# Patient Record
Sex: Male | Born: 1955 | Race: White | Hispanic: No | Marital: Married | Smoking: Never smoker
Health system: Southern US, Community
[De-identification: ages and names within clinical notes are randomized; demographics above are authoritative.]

## PROBLEM LIST (undated history)

## (undated) DIAGNOSIS — Z8739 Personal history of other diseases of the musculoskeletal system and connective tissue: Secondary | ICD-10-CM

## (undated) DIAGNOSIS — I10 Essential (primary) hypertension: Secondary | ICD-10-CM

## (undated) DIAGNOSIS — E039 Hypothyroidism, unspecified: Secondary | ICD-10-CM

## (undated) DIAGNOSIS — F32A Depression, unspecified: Secondary | ICD-10-CM

## (undated) DIAGNOSIS — F329 Major depressive disorder, single episode, unspecified: Secondary | ICD-10-CM

## (undated) DIAGNOSIS — E119 Type 2 diabetes mellitus without complications: Secondary | ICD-10-CM

## (undated) DIAGNOSIS — C801 Malignant (primary) neoplasm, unspecified: Secondary | ICD-10-CM

## (undated) DIAGNOSIS — R590 Localized enlarged lymph nodes: Secondary | ICD-10-CM

## (undated) DIAGNOSIS — G43909 Migraine, unspecified, not intractable, without status migrainosus: Secondary | ICD-10-CM

## (undated) HISTORY — PX: TONSILLECTOMY: SHX5217

## (undated) HISTORY — DX: Depression, unspecified: F32.A

## (undated) HISTORY — DX: Malignant (primary) neoplasm, unspecified: C80.1

## (undated) HISTORY — DX: Personal history of other diseases of the musculoskeletal system and connective tissue: Z87.39

## (undated) HISTORY — DX: Migraine, unspecified, not intractable, without status migrainosus: G43.909

## (undated) HISTORY — DX: Type 2 diabetes mellitus without complications: E11.9

## (undated) HISTORY — PX: LITHOTRIPSY: SUR834

## (undated) HISTORY — PX: TOOTH EXTRACTION: SUR596

## (undated) HISTORY — DX: Major depressive disorder, single episode, unspecified: F32.9

---

## 1998-09-14 ENCOUNTER — Encounter: Payer: Self-pay | Admitting: Urology

## 1998-09-14 ENCOUNTER — Ambulatory Visit (HOSPITAL_COMMUNITY): Admission: RE | Admit: 1998-09-14 | Discharge: 1998-09-14 | Payer: Self-pay | Admitting: Urology

## 1999-03-26 ENCOUNTER — Encounter: Admission: RE | Admit: 1999-03-26 | Discharge: 1999-03-26 | Payer: Self-pay | Admitting: Orthopedic Surgery

## 1999-06-03 ENCOUNTER — Ambulatory Visit (HOSPITAL_BASED_OUTPATIENT_CLINIC_OR_DEPARTMENT_OTHER): Admission: RE | Admit: 1999-06-03 | Discharge: 1999-06-03 | Payer: Self-pay | Admitting: Orthopedic Surgery

## 1999-08-28 ENCOUNTER — Encounter: Payer: Self-pay | Admitting: Family Medicine

## 1999-08-28 ENCOUNTER — Encounter: Admission: RE | Admit: 1999-08-28 | Discharge: 1999-08-28 | Payer: Self-pay | Admitting: Family Medicine

## 2002-12-06 ENCOUNTER — Encounter: Admission: RE | Admit: 2002-12-06 | Discharge: 2002-12-06 | Payer: Self-pay | Admitting: Family Medicine

## 2002-12-06 ENCOUNTER — Encounter: Payer: Self-pay | Admitting: Family Medicine

## 2003-01-17 ENCOUNTER — Ambulatory Visit (HOSPITAL_COMMUNITY): Admission: RE | Admit: 2003-01-17 | Discharge: 2003-01-17 | Payer: Self-pay | Admitting: Family Medicine

## 2003-01-17 ENCOUNTER — Encounter: Payer: Self-pay | Admitting: Family Medicine

## 2009-05-28 ENCOUNTER — Encounter: Admission: RE | Admit: 2009-05-28 | Discharge: 2009-06-14 | Payer: Self-pay | Admitting: Orthopedic Surgery

## 2009-06-18 ENCOUNTER — Encounter: Admission: RE | Admit: 2009-06-18 | Discharge: 2009-07-31 | Payer: Self-pay | Admitting: Orthopedic Surgery

## 2009-07-09 ENCOUNTER — Encounter: Admission: RE | Admit: 2009-07-09 | Discharge: 2009-07-09 | Payer: Self-pay | Admitting: Family Medicine

## 2010-01-08 ENCOUNTER — Encounter: Admission: RE | Admit: 2010-01-08 | Discharge: 2010-01-08 | Payer: Self-pay | Admitting: Family Medicine

## 2010-04-24 ENCOUNTER — Encounter
Admission: RE | Admit: 2010-04-24 | Discharge: 2010-06-11 | Payer: Self-pay | Source: Home / Self Care | Attending: Neurosurgery | Admitting: Neurosurgery

## 2010-06-20 ENCOUNTER — Encounter
Admission: RE | Admit: 2010-06-20 | Discharge: 2010-07-15 | Payer: Self-pay | Source: Home / Self Care | Attending: Neurosurgery | Admitting: Neurosurgery

## 2010-06-24 ENCOUNTER — Encounter: Admit: 2010-06-24 | Payer: Self-pay | Admitting: Neurosurgery

## 2015-02-01 ENCOUNTER — Other Ambulatory Visit: Payer: Self-pay | Admitting: Family Medicine

## 2015-02-01 DIAGNOSIS — R229 Localized swelling, mass and lump, unspecified: Principal | ICD-10-CM

## 2015-02-01 DIAGNOSIS — IMO0002 Reserved for concepts with insufficient information to code with codable children: Secondary | ICD-10-CM

## 2015-02-05 ENCOUNTER — Ambulatory Visit
Admission: RE | Admit: 2015-02-05 | Discharge: 2015-02-05 | Disposition: A | Discharge status: Home / Self Care | Payer: Commercial Indemnity | Source: Ambulatory Visit | Attending: Family Medicine | Admitting: Family Medicine

## 2015-02-05 DIAGNOSIS — IMO0002 Reserved for concepts with insufficient information to code with codable children: Secondary | ICD-10-CM

## 2015-02-05 DIAGNOSIS — R229 Localized swelling, mass and lump, unspecified: Principal | ICD-10-CM

## 2015-05-16 ENCOUNTER — Other Ambulatory Visit: Payer: Self-pay | Admitting: Otolaryngology

## 2015-05-16 DIAGNOSIS — R590 Localized enlarged lymph nodes: Secondary | ICD-10-CM

## 2015-07-12 ENCOUNTER — Ambulatory Visit
Admission: RE | Admit: 2015-07-12 | Discharge: 2015-07-12 | Disposition: A | Discharge status: Home / Self Care | Payer: 59 | Source: Ambulatory Visit | Attending: Otolaryngology | Admitting: Otolaryngology

## 2015-07-12 DIAGNOSIS — R590 Localized enlarged lymph nodes: Secondary | ICD-10-CM

## 2015-07-12 MED ORDER — IOPAMIDOL (ISOVUE-300) INJECTION 61%
75.0000 mL | Freq: Once | INTRAVENOUS | Status: AC | PRN
  Administered 2015-07-12: 75 mL via INTRAVENOUS

## 2015-07-23 ENCOUNTER — Other Ambulatory Visit (HOSPITAL_COMMUNITY)
Admission: RE | Admit: 2015-07-23 | Discharge: 2015-07-23 | Disposition: A | Discharge status: Home / Self Care | Payer: 59 | Source: Ambulatory Visit | Attending: Otolaryngology | Admitting: Otolaryngology

## 2015-07-23 ENCOUNTER — Other Ambulatory Visit: Payer: Self-pay | Admitting: Otolaryngology

## 2015-07-23 DIAGNOSIS — R590 Localized enlarged lymph nodes: Secondary | ICD-10-CM | POA: Diagnosis present

## 2015-07-31 ENCOUNTER — Other Ambulatory Visit (HOSPITAL_COMMUNITY): Payer: Self-pay | Admitting: Otolaryngology

## 2015-07-31 DIAGNOSIS — C770 Secondary and unspecified malignant neoplasm of lymph nodes of head, face and neck: Secondary | ICD-10-CM

## 2015-08-03 ENCOUNTER — Encounter (HOSPITAL_COMMUNITY): Payer: 59

## 2015-08-09 ENCOUNTER — Ambulatory Visit (HOSPITAL_COMMUNITY)
Admission: RE | Admit: 2015-08-09 | Discharge: 2015-08-09 | Disposition: A | Discharge status: Home / Self Care | Payer: 59 | Source: Ambulatory Visit | Attending: Otolaryngology | Admitting: Otolaryngology

## 2015-08-09 DIAGNOSIS — C801 Malignant (primary) neoplasm, unspecified: Secondary | ICD-10-CM | POA: Diagnosis not present

## 2015-08-09 DIAGNOSIS — N200 Calculus of kidney: Secondary | ICD-10-CM | POA: Insufficient documentation

## 2015-08-09 DIAGNOSIS — J359 Chronic disease of tonsils and adenoids, unspecified: Secondary | ICD-10-CM | POA: Insufficient documentation

## 2015-08-09 DIAGNOSIS — C770 Secondary and unspecified malignant neoplasm of lymph nodes of head, face and neck: Secondary | ICD-10-CM

## 2015-08-09 DIAGNOSIS — K5730 Diverticulosis of large intestine without perforation or abscess without bleeding: Secondary | ICD-10-CM | POA: Diagnosis not present

## 2015-08-09 LAB — GLUCOSE, CAPILLARY: Glucose-Capillary: 165 mg/dL — ABNORMAL HIGH (ref 65–99)

## 2015-08-09 MED ORDER — FLUDEOXYGLUCOSE F - 18 (FDG) INJECTION
10.1500 | Freq: Once | INTRAVENOUS | Status: AC | PRN
  Administered 2015-08-09: 10.15 via INTRAVENOUS

## 2015-08-13 ENCOUNTER — Telehealth: Payer: Self-pay | Admitting: *Deleted

## 2015-08-13 ENCOUNTER — Telehealth: Payer: Self-pay | Admitting: Hematology and Oncology

## 2015-08-13 NOTE — Telephone Encounter (Signed)
  Oncology Nurse Navigator Documentation  Navigator Location: [facility] (08/13/15 1403) Navigator Encounter Type: Introductory phone call (08/13/15 1403)   Abnormal Finding Date: 07/12/15 (08/13/15 1403) Confirmed Diagnosis Date: 07/23/15 (08/13/15 1403)         Barriers/Navigation Needs: Coordination of Care (08/13/15 1403)   Interventions: Coordination of Care (08/13/15 1403)       Placed introductory call to new referral patient.  Introduced myself as the oncology nurse navigator that works with Drs. Isidore Moos and Alvy Bimler to whom he has been referred by Dr. Constance Holster.  He confirmed understanding of referral and appt date/times of 8:30 NE, 9:00 Dr. Isidore Moos and 10:30 Dr. Alvy Bimler.  He indicated his wife will be joining him.  I briefly explained my role as a navigator, indicated that I would be joining them during these appts.  I confirmed understanding of the [facility] location, explained arrival and RadOnc registration process for appt. I explained the purpose of a dental evaluation prior to starting RT, indicated he wd be contacted by Dr. Ritta Slot office to arrange an appt within a day or so of his appt with Dr. Isidore Moos.   I provided my contact information, encouraged him to call with questions/concerns before Friday's appts.   He verbalized understanding of information provided, expressed appreciation for my call.  Gayleen Orem, RN, BSN, [facility] [phone]                     Time Spent with Patient: 15 (08/13/15 1403)

## 2015-08-13 NOTE — Telephone Encounter (Signed)
S/w patient and gave np appt for 03/03 @ 10:30 w/Dr. Alvy Bimler.

## 2015-08-14 NOTE — Progress Notes (Signed)
Head and Neck Cancer Location of Tumor / Histology: PET impression states: IMPRESSION: 1. 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 Recommend correlation with direct visualization. 2. Hypermetabolic ipsilateral nodal metastases in the right level 2 neck.   Patient presented with symptoms of: He noticed a lump in his neck on the Right side this past March. He reported to Dr. Constance Holster that it had not changed or caused any pain or discomfort.   Biopsies of revealed:  Fine Needle Aspriation performed 07/23/15 07/23/15 Diagnosis LYMPH NODE, FINE NEEDLE ASPIRATION, RIGHT LEVEL 3, (SPECIMEN 1 OF 1 COLLECTED 07/23/2015) MALIGNANT CELLS CONSISTENT WITH SQUAMOUS CELL CARCINOMA.  Nutrition Status Yes No Comments  Weight changes? [x]  []  He reports some weight loss, but it was an intention diet change. He reports maybe 10-12 lbs.   Swallowing concerns? []  [x]    PEG? []  [x]     Referrals Yes No Comments  Social Work? []  [x]    Dentistry? []  [x]  They plan to call and schedule one next week.   Swallowing therapy? []  [x]    Nutrition? []  [x]    Med/Onc? []  [x]  Appointment scheduled for 10:30 with Dr. Alvy Bimler today (08/17/15)   Safety Issues Yes No Comments  Prior radiation? []  [x]    Pacemaker/ICD? []  [x]    Possible current pregnancy? []  [x]    Is the patient on methotrexate? []  [x]     Tobacco/Marijuana/Snuff/ETOH use:  Never smoker   Past/Anticipated interventions by otolaryngology, if any: Dr. Constance Holster saw him on 07/23/15 and performed a fine needle aspiration.  Past/Anticipated interventions by medical oncology, if any: He has an appointment with Dr. Alvy Bimler today (3/3?17) at 10:30     Current Complaints / other details:   PET scan 08/09/15 IMPRESSION: 1. Focal asymmetric hypermetabolism in the anterior right tonsillar pillar/right palatine tonsil, likely to represent a primary head  & neck squamous cell carcinoma. Recommend correlation with direct visualization. 2. Hypermetabolic ipsilateral nodal metastases in the right level 2 neck. 3. No hypermetabolic distant metastatic disease. 4. Nonobstructing tiny left renal stones. 5. Marked sigmoid diverticulosis.  BP 135/68 mmHg  Pulse 81  Temp(Src) 98.8 F (37.1 C)  Ht 5' 9.5" (1.765 m)  Wt 208 lb 12.8 oz (94.711 kg)  BMI 30.40 kg/m2  SpO2 98%   Wt Readings from Last 3 Encounters:  08/17/15 208 lb 12.8 oz (94.711 kg)

## 2015-08-15 ENCOUNTER — Encounter: Payer: Self-pay | Admitting: Hematology and Oncology

## 2015-08-15 DIAGNOSIS — C099 Malignant neoplasm of tonsil, unspecified: Secondary | ICD-10-CM | POA: Insufficient documentation

## 2015-08-16 ENCOUNTER — Encounter: Payer: Self-pay | Admitting: Radiation Oncology

## 2015-08-16 ENCOUNTER — Telehealth (HOSPITAL_COMMUNITY): Payer: Self-pay

## 2015-08-16 NOTE — Telephone Encounter (Signed)
08/16/15         Patient called to cancelled Dental Consult appt. w/Dr. Enrique Sack on 08/20/15 @ [id number] due to a conflict w/another appt.   Patient will call back to reschedule at a later date.   LRI

## 2015-08-16 NOTE — Telephone Encounter (Signed)
08/16/15            Called and left mgs. for patient to contact Dental Medicine to schedule a Dental Consult w/Dr. Enrique Sack.   LRI

## 2015-08-17 ENCOUNTER — Encounter: Payer: Self-pay | Admitting: Hematology and Oncology

## 2015-08-17 ENCOUNTER — Ambulatory Visit
Admission: RE | Admit: 2015-08-17 | Discharge: 2015-08-17 | Disposition: A | Discharge status: Home / Self Care | Payer: 59 | Source: Ambulatory Visit | Attending: Radiation Oncology | Admitting: Radiation Oncology

## 2015-08-17 ENCOUNTER — Encounter: Payer: Self-pay | Admitting: *Deleted

## 2015-08-17 ENCOUNTER — Encounter: Payer: Self-pay | Admitting: Radiation Oncology

## 2015-08-17 ENCOUNTER — Ambulatory Visit (HOSPITAL_BASED_OUTPATIENT_CLINIC_OR_DEPARTMENT_OTHER): Payer: 59 | Admitting: Hematology and Oncology

## 2015-08-17 ENCOUNTER — Telehealth: Payer: Self-pay | Admitting: *Deleted

## 2015-08-17 VITALS — BP 135/68 | HR 81 | Temp 98.8°F | Ht 69.5 in | Wt 208.8 lb

## 2015-08-17 VITALS — BP 141/93 | HR 88 | Temp 98.1°F | Resp 18 | Wt 208.2 lb

## 2015-08-17 DIAGNOSIS — Z8051 Family history of malignant neoplasm of kidney: Secondary | ICD-10-CM

## 2015-08-17 DIAGNOSIS — H9191 Unspecified hearing loss, right ear: Secondary | ICD-10-CM | POA: Diagnosis not present

## 2015-08-17 DIAGNOSIS — R5382 Chronic fatigue, unspecified: Secondary | ICD-10-CM | POA: Diagnosis not present

## 2015-08-17 DIAGNOSIS — Z808 Family history of malignant neoplasm of other organs or systems: Secondary | ICD-10-CM

## 2015-08-17 DIAGNOSIS — C099 Malignant neoplasm of tonsil, unspecified: Secondary | ICD-10-CM | POA: Diagnosis not present

## 2015-08-17 DIAGNOSIS — G894 Chronic pain syndrome: Secondary | ICD-10-CM

## 2015-08-17 DIAGNOSIS — H9193 Unspecified hearing loss, bilateral: Secondary | ICD-10-CM

## 2015-08-17 DIAGNOSIS — C091 Malignant neoplasm of tonsillar pillar (anterior) (posterior): Secondary | ICD-10-CM

## 2015-08-17 HISTORY — DX: Hypothyroidism, unspecified: E03.9

## 2015-08-17 HISTORY — DX: Localized enlarged lymph nodes: R59.0

## 2015-08-17 HISTORY — DX: Essential (primary) hypertension: I10

## 2015-08-17 NOTE — Progress Notes (Signed)
Box CONSULT NOTE  No care team member to display  CHIEF COMPLAINTS/PURPOSE OF CONSULTATION:  Right tonsil cancer, HPV unavailable  HISTORY OF PRESENTING ILLNESS:  Dwayne Williams 60 y.o. male is here because of newly diagnosed recent diagnosis of right tonsil cancer According to the patient, the first initial presentation was due to right neck swelling that has been present for over a year. The patient has baseline hearing deficit I reviewed his records extensively and collaborated the history with the patient, family members and review of records. Summary of oncologic history as follows:   Tonsil cancer ([place])   07/12/2015 Imaging CT neck showed prominent tonsil and bilateral LN involvement in the neck   07/23/2015 Procedure He had FNA of right level 3 LN in the neck   07/23/2015 Pathology Results [id number] is positive for invasive squamous cell carcinoma   08/09/2015 Imaging PET CT showed abnormal metabolism over the right tonsil region and bilateral LN involvement    he denies difficulties with chewing food, swallowing difficulties, painful swallowing, changes in the quality of his voice or abnormal weight loss. He is contemplating second opinion for surgical option at [facility] in the near future He denies new lymphadenopathy elsewhere He has profound chronic fatigue and chronic back pain. He is not working  MEDICAL HISTORY:  Past Medical History  Diagnosis Date  . Hypothyroidism   . Hypertension   . Cervical lymphadenopathy   . Diabetes mellitus without complication ([place])   . Cancer ([place])     TOnsil ca  . Depression     SURGICAL HISTORY: Past Surgical History  Procedure Laterality Date  . Tooth extraction    . Tonsillectomy    . Lithotripsy    . Chronic back pain      SOCIAL HISTORY: Social History   Social History  . Marital Status: Married    Spouse Name: N/A  . Number of Children: N/A  . Years of Education: N/A   Occupational History   . Not on file.   Social History Main Topics  . Smoking status: Never Smoker   . Smokeless tobacco: Never Used  . Alcohol Use: No  . Drug Use: No  . 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 Not on file   Social History Narrative    FAMILY HISTORY: Family History  Problem Relation Age of Onset  . Cancer Father     kidney ca  . Cancer Brother     throat ca  . Cancer Maternal Uncle     melanoma  . Cancer Paternal Uncle     brain ca    ALLERGIES:  is allergic to latex and penicillins.  MEDICATIONS:  Current Outpatient Prescriptions  Medication Sig Dispense Refill  . AMLODIPINE BESYLATE PO Take 10 mg by mouth daily.    . finasteride (PROPECIA) 1 MG tablet     . levothyroxine (SYNTHROID, LEVOTHROID) 25 MCG tablet Take 25 mcg by mouth daily before breakfast.    . losartan (COZAAR) 100 MG tablet Take 100 mg by mouth daily.    . metFORMIN (GLUCOPHAGE) 500 MG tablet Take by mouth 2 (two) times daily with a meal.    . oxyCODONE-acetaminophen (PERCOCET) 10-325 MG tablet Take 1 tablet by mouth every 4 (four) hours as needed for pain.    . rizatriptan (MAXALT-MLT) 10 MG disintegrating tablet Take 10 mg by mouth as needed for  migraine. May repeat in 2 hours if needed    . venlafaxine XR (EFFEXOR-XR) 150 MG 24 hr capsule Take 150 mg by mouth daily with breakfast.     No current facility-administered medications for this visit.    REVIEW OF SYSTEMS:   Constitutional: Denies fevers, chills or abnormal night sweats Eyes: Denies blurriness of vision, double vision or watery eyes Ears, nose, mouth, throat, and face: Denies mucositis or sore throat Respiratory: Denies cough, dyspnea or wheezes Cardiovascular: Denies palpitation, chest discomfort or lower extremity swelling Gastrointestinal:  Denies nausea, heartburn or change in bowel habits Skin: Denies abnormal skin rashes Neurological:Denies numbness,  tingling or new weaknesses Behavioral/Psych: Mood is stable, no new changes  All other systems were reviewed with the patient and are negative.  PHYSICAL EXAMINATION: ECOG PERFORMANCE STATUS: 1 - Symptomatic but completely ambulatory  Filed Vitals:   08/17/15 1028  BP: 141/93  Pulse: 88  Temp: 98.1 F (36.7 C)  Resp: 18   Filed Weights   08/17/15 1028  Weight: 208 lb 3.2 oz (94.439 kg)    GENERAL:alert, no distress and comfortable. He is obese SKIN: skin color, texture, turgor are normal, no rashes or significant lesions EYES: normal, conjunctiva are pink and non-injected, sclera clear OROPHARYNX:no exudate, no erythema and lips, buccal mucosa, and tongue normal  NECK: supple, thyroid normal size, non-tender, without nodularity LYMPH:she has palpable lymphadenopathy on the right side the neck S: clear to auscultation and percussion with normal breathing effort HEART: regular rate & rhythm and no murmurs and no lower extremity edema ABDOMEN:abdomen soft, non-tender and normal bowel sounds Musculoskeletal:no cyanosis of digits and no clubbing  PSYCH: alert & oriented x 3 with fluent speech NEURO: no focal motor/sensory deficits  LABORATORY DATA:  I have reviewed the data as listed RADIOGRAPHIC STUDIES: I have personally reviewed the radiological images as listed and agreed with the findings in the report. Nm Pet Image Initial (pi) Skull Base To Thigh  08/09/2015  CLINICAL DATA:  Initial treatment strategy for squamous cell carcinoma detected within right level 3 lymph node on 07/23/2015 ultrasound-guided FNA. EXAM: NUCLEAR MEDICINE PET SKULL BASE TO THIGH TECHNIQUE: 10.2 mCi F-18 FDG was injected intravenously. Full-ring PET imaging was performed from the skull base to thigh after the radiotracer. CT data was obtained and used for attenuation correction and anatomic localization. FASTING BLOOD GLUCOSE:  Value: 165 mg/dl COMPARISON:  07/12/2015 head CT. 09/17/2010 unenhanced CT  abdomen/pelvis. FINDINGS: NECK There is asymmetry focal intense FDG uptake with max SUV 10.5 in the anterior right tonsillar pillar/right palatine tonsil, which is likely to represent the primary mucosal malignancy. There is a hypermetabolic 1.6 cm high right level 2 lymph node posterior to the right mandibular angle (series 4/ image 27) with max SUV 4.9. There is a hypermetabolic 2.0 cm low right level 2 lymph node (series 4/ image 32) with max SUV 5.4. There is a mildly hypermetabolic 0.8 cm posterior right level 2 lymph node just medial to the posterior margin of the right sternocleidomastoid muscle (series 4/image 30) with max SUV 3.0. There are no additional hypermetabolic or enlarged cervical lymph nodes. CHEST No hypermetabolic axillary, mediastinal or hilar nodes. No acute consolidative airspace disease or significant pulmonary nodules. ABDOMEN/PELVIS No abnormal hypermetabolic activity within the liver, pancreas, adrenal glands, or spleen. No hypermetabolic lymph nodes in the abdomen or pelvis. Simple 1.2 cm renal cyst in the posterior interpolar right kidney. Nonobstructing 2 mm stone in the interpolar left kidney. Nonobstructing 1 mm stone in the  upper left kidney. No hydronephrosis. Marked sigmoid diverticulosis. SKELETON No focal hypermetabolic activity to suggest skeletal metastasis. Subcutaneous FDG uptake in the left antecubital fossa is injection site related. IMPRESSION: 1. 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 Recommend correlation with direct visualization. 2. Hypermetabolic ipsilateral nodal metastases in the right level 2 neck. 3. No hypermetabolic distant metastatic disease. 4. Nonobstructing tiny left renal stones. 5. Marked sigmoid diverticulosis. Electronically Signed   By: Ilona Sorrel M.D.   On: 08/09/2015 09:35    ASSESSMENT:  Newly diagnosed squamous cell  carcinoma of the Head & Neck, HPV N/A  PLAN:  Hearing loss of both ears For this reason, I will not prescribe cisplatin. I recommend baseline hearing test before we proceed with treatment  Chronic pain disorder The patient take chronic narcotic prescription for chronic back pain. During treatment, I will take over pain management but after his treatment is completed, he will resume chronic pain management through his primary care doctor.   Tonsil cancer [facility]) We discussed multidisciplinary approach to management of locally advanced tonsil cancer. 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 Given his baseline hearing loss, I will recommend concurrent treatment with cetuximab instead of cisplatin. He would need placement of feeding tube and port prior to the start of treatment as well as attendance to chemotherapy teaching class, referral to nutritionist, speech and language therapist, physical therapist and dentist evaluation. The patient desire second opinion at [facility] and he will be referred there prior to initiation of treatment. I will wait to hear back from him before proceeding with plan as outlined above    All questions were answered. The patient knows to call the clinic with any problems, questions or concerns. I spent 40 minutes counseling the patient face to face. The total time spent in the appointment was 60 minutes and more than 50% was on counseling.     [place], [place], MD 08/17/2015 1:50 PM

## 2015-08-17 NOTE — Progress Notes (Signed)
Radiation Oncology         [phone] ________________________________  Initial outpatient Consultation  Name: Dwayne Williams MRN: [id number]  Date: 08/17/2015  DOB: Jan 28, 1956  CC:No primary care provider on file.  Izora Gala, MD   REFERRING PHYSICIAN: Izora Gala, MD  DIAGNOSIS:    ICD-9-CM ICD-10-CM   1. Tonsil cancer ([place]) 146.0 C09.9 Ambulatory referral to Social Work  2. 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 male who presented with a lump in the right neck since 08-2014. Subsequently, the patient saw Dr. Constance Holster who biopsied a neck node (FNA) on 07-23-15. Biopsy of the node revealed: malignant cell consistent with Squamous cell carcinoma. p16 couldn't be tested on scant tissue.  Pertinent imaging thus far includes CT of Neck with contrast performed on 07-13-15 revealing right neck adenopathy. PET on 08-09-15 revealed asymmetric hypermetabolism in the anterior right tonsillar pillar/ palatine tonsil as right neck nodal metastatic. No distant metastases.   On today's visit, the patient states he is chronically fatigued and is concerned that radiation treatment will exacerbate his thyroid issues. We discussed this in detail. States both his tonsils were taken out as a child.   Swallowing issues, if any: None  Weight Changes: He reports an intentional diet with weight loss of ~ 10-12 lbs  Pain status: None reported   Other symptoms: Patient states he is chronically fatigued. The patient is currently on thyroid medication.   Tobacco history, if any: He denies any history of smoking   ETOH abuse, if any: No significant alcohol history  Prior cancers, if any: No  PREVIOUS RADIATION THERAPY: No  PAST MEDICAL HISTORY:  has a past medical history of Hypothyroidism; Hypertension; and Cervical lymphadenopathy.    PAST SURGICAL  HISTORY: Past Surgical History  Procedure Laterality Date  . Tooth extraction    . Tonsillectomy    . Lithotripsy      FAMILY HISTORY: Brother had head and neck cancer.  SOCIAL HISTORY:  reports that he has never smoked. He does not have any smokeless tobacco history on file.  ALLERGIES: Latex and Penicillins  MEDICATIONS:  Current Outpatient Prescriptions  Medication Sig Dispense Refill  . AMLODIPINE BESYLATE PO Take 10 mg by mouth daily.    Marland Kitchen levothyroxine (SYNTHROID, LEVOTHROID) 25 MCG tablet Take 25 mcg by mouth daily before breakfast.    . losartan (COZAAR) 100 MG tablet Take 100 mg by mouth daily.    . metFORMIN (GLUCOPHAGE) 500 MG tablet Take by mouth 2 (two) times daily with a meal.    . oxyCODONE-acetaminophen (PERCOCET) 10-325 MG tablet Take 1 tablet by mouth every 4 (four) hours as needed for pain.    . rizatriptan (MAXALT-MLT) 10 MG disintegrating tablet Take 10 mg by mouth as needed for migraine. May repeat in 2 hours if needed    . venlafaxine XR (EFFEXOR-XR) 150 MG 24 hr capsule Take 150 mg by mouth daily with breakfast.    . finasteride (PROPECIA) 1 MG tablet     . oxyCODONE-acetaminophen (PERCOCET/ROXICET) 5-325 MG tablet      No current facility-administered medications for this encounter.   REVIEW OF SYSTEMS:  Notable for that above.    PHYSICAL EXAM:  height is 5' 9.5" (1.765 m) and weight is 208 lb 12.8 oz (94.711 kg). His temperature is 98.8 F (37.1 C). His blood pressure  is 135/68 and his pulse is 81. His oxygen saturation is 98%.   General: Alert and oriented, in no acute distress HEENT: Head is normocephalic. Extraocular movements are intact. Oropharynx is clear, no tumor or thrush seen. Neck: Neck is notable for a right level 2 mass approximately 2.5 cm in greatest dimension. I cannot appreciate any other cervical or supraclavicular masses.  Heart: Regular in rate and rhythm with no murmurs, rubs, or gallops. Chest: Clear to auscultation bilaterally,  with no rhonchi, wheezes, or rales. Abdomen: Soft, nontender, nondistended, with no rigidity or guarding. Extremities: No cyanosis or edema. Lymphatics: see Neck Exam Skin: No concerning lesions. Musculoskeletal: symmetric strength and muscle tone throughout. Neurologic: Cranial nerves II through XII are grossly intact. No obvious focalities. Speech is fluent. Coordination is intact. Strength is symmetric in his extremities.  Psychiatric: Judgment and insight are intact. Affect is appropriate.  ECOG = 1  0 - Asymptomatic (Fully active, able to carry on all predisease activities without restriction)  1 - Symptomatic but completely ambulatory (Restricted in physically strenuous activity but ambulatory and able to carry out work of a light or sedentary nature. For example, light housework, office work)  2 - Symptomatic, <50% in bed during the day (Ambulatory and capable of all self care but unable to carry out any work activities. Up and about more than 50% of waking hours)  3 - Symptomatic, >50% in bed, but not bedbound (Capable of only limited self-care, confined to bed or chair 50% or more of waking hours)  4 - Bedbound (Completely disabled. Cannot carry on any self-care. Totally confined to bed or chair)  5 - Death   Eustace Pen MM, Creech RH, Tormey DC, et al. [phone]). "Toxicity and response criteria of the [facility]". [place] Oncol. 5 (6): 649-55   LABORATORY DATA:  No results found for: WBC, HGB, HCT, MCV, PLT CMP  No results found for: NA, K, CL, CO2, GLUCOSE, BUN, CREATININE, CALCIUM, PROT, ALBUMIN, AST, ALT, ALKPHOS, BILITOT, GFRNONAA, GFRAA    No results found for: TSH    RADIOGRAPHY: Nm Pet Image Initial (pi) Skull Base To Thigh  08/09/2015  CLINICAL DATA:  Initial treatment strategy for squamous cell carcinoma detected within right level 3 lymph node on 07/23/2015 ultrasound-guided FNA. EXAM: NUCLEAR MEDICINE PET SKULL BASE TO THIGH TECHNIQUE: 10.2  mCi F-18 FDG was injected intravenously. Full-ring PET imaging was performed from the skull base to thigh after the radiotracer. CT data was obtained and used for attenuation correction and anatomic localization. FASTING BLOOD GLUCOSE:  Value: 165 mg/dl COMPARISON:  07/12/2015 head CT. 09/17/2010 unenhanced CT abdomen/pelvis. FINDINGS: NECK There is asymmetry focal intense FDG uptake with max SUV 10.5 in the anterior right tonsillar pillar/right palatine tonsil, which is likely to represent the primary mucosal malignancy. There is a hypermetabolic 1.6 cm high right level 2 lymph node posterior to the right mandibular angle (series 4/ image 27) with max SUV 4.9. There is a hypermetabolic 2.0 cm low right level 2 lymph node (series 4/ image 32) with max SUV 5.4. There is a mildly hypermetabolic 0.8 cm posterior right level 2 lymph node just medial to the posterior margin of the right sternocleidomastoid muscle (series 4/image 30) with max SUV 3.0. There are no additional hypermetabolic or enlarged cervical lymph nodes. CHEST No hypermetabolic axillary, mediastinal or hilar nodes. No acute consolidative airspace disease or significant pulmonary nodules. ABDOMEN/PELVIS No abnormal hypermetabolic activity within the liver, pancreas, adrenal glands, or spleen. No hypermetabolic  lymph nodes in the abdomen or pelvis. Simple 1.2 cm renal cyst in the posterior interpolar right kidney. Nonobstructing 2 mm stone in the interpolar left kidney. Nonobstructing 1 mm stone in the upper left kidney. No hydronephrosis. Marked sigmoid diverticulosis. SKELETON No focal hypermetabolic activity to suggest skeletal metastasis. Subcutaneous FDG uptake in the left antecubital fossa is injection site related. IMPRESSION: 1. 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 Recommend correlation with direct visualization. 2.  Hypermetabolic ipsilateral nodal metastases in the right level 2 neck. 3. No hypermetabolic distant metastatic disease. 4. Nonobstructing tiny left renal stones. 5. Marked sigmoid diverticulosis. Electronically Signed   By: Ilona Sorrel M.D.   On: 08/09/2015 09:35      IMPRESSION/PLAN:  This is a delightful patient with head and neck cancer. I did recommend radiotherapy for this patient. He has been discussed at ENT tumor board.   He is, however, extremely worried about the side effect we discussed today.  His wife states "I think he would die" during treatment.  She states he has a low threshhold for side effects and a high likelihood of stopping treatment.  Patient also concerned about severe baseline fatigue. He's very against chemotherapy but sees med onc today.  In regards to the patient's concerns for radiation therapy, we discussed the possibility for surgical options, specifically at [facility], if he were not to proceed with radiation treatment. We discussed the possibility for post-operative radiation and that it would be a shorter course of treatment than just radiation alone. At this time, the patient is interested in consulting surgical options at [facility].   We discussed the potential risks, benefits, and side effects of radiotherapy. We talked in detail about acute and late effects. We discussed that some of the most bothersome acute effects may be mucositis, dysgeusia, salivary changes, skin irritation, hair loss,  weight loss and fatigue.  No guarantees of treatment were given.  I answered all of the patient's questions.   Simulation (treatment planning) will take place after clearance from dentistry AND only if patient reports that he has decided to proceed with RT.  But, I sense he is learning toward surgery. Dentistry scheduled for March 7th.   We also discussed that the treatment of head and neck cancer is a multidisciplinary process to maximize treatment outcomes and quality of  life.  He's tentatively scheduled for [facility] clinic with various supportive services on March 14, but this will be cancelled if he pursues treatment at [facility] with surgery instead.  The patient knows to contact radiation oncology in regards to his treatment plans after consultation with [facility] ENT.  I am referring him to Dr Nicolette Bang who is an excellent TORS surgeon.  I will see him back PRN.  He has our contact information and knows to call after his appt w/ Dr Nicolette Bang.  __________________________________________   Eppie Gibson, MD  This document serves as a record of services personally performed by Eppie Gibson, MD. It was created on her behalf by Derek Mound, a trained medical scribe. The creation of this record is based on the scribe's personal observations and the provider's statements to them. This document has been checked and approved by the attending provider.

## 2015-08-17 NOTE — Progress Notes (Signed)
  Oncology Nurse Navigator Documentation  Navigator Location: [facility] (08/17/15 0830) Navigator Encounter Type: Initial RadOnc (08/17/15 0830)               Barriers/Navigation Needs: Education (08/17/15 0830) Education: Understanding Cancer/ Treatment Options (08/17/15 0830)       Met with patient during initial consult with Dr. Isidore Moos.  He was accompanied by his wife [name].  1. Further introduced myself as his Navigator, explained my role as a member of the Care Team.   2. Provided New Patient Information packet, discussed contents:  Contact information for physician(s), myself, other members of the Care Team.  Advance Directive information ([place] blue pamphlet with LCSW contact info).  He indicated he as ADs, will bring a copy for scanning during his next visit.  Fall Prevention Patient Safety Plan  Appointment Guideline  Financial Assistance Information sheet  [place] with highlight of [place] 3. Provided introductory explanation of radiation treatment including SIM planning and purpose of Aquaplast head and shoulder mask, showed them example.   4. Provided photos/diagrams of feeding tube and PAC, explained their purpose. 5. He expressed:  Has experienced chronic fatigue for several years, is concerned about increased fatigue from XRT.  Concern that he would be able to completed 7 weeks XRT.   Interest in referral to [facility] to discuss surgical tmt option.   Understanding that XRT is frequently recommended s/p surgery. 6.   I escorted them to [facility] lobby to register for appt with Dr. Alvy Bimler.  Gayleen Orem, RN, BSN, [place] at [place] [phone]                     Time Spent with Patient: > 120 (08/17/15 0830)

## 2015-08-17 NOTE — Telephone Encounter (Signed)
  Oncology Nurse Navigator Documentation  Navigator Location: [facility] (08/17/15 1458) Navigator Encounter Type: Telephone (08/17/15 1458)               Barriers/Navigation Needs: Coordination of Care (08/17/15 1458)       Per Dr. Calton Dach guidance, called [facility], spoke with Lovey Newcomer, asked that she contact patient to schedule a baseline audiology test.  She verbalized understanding.  Gayleen Orem, RN, BSN, [place] at [place] [phone]                       Time Spent with Patient: 15 (08/17/15 1458)

## 2015-08-17 NOTE — Assessment & Plan Note (Signed)
The patient take chronic narcotic prescription for chronic back pain. During treatment, I will take over pain management but after his treatment is completed, he will resume chronic pain management through his primary care doctor.

## 2015-08-17 NOTE — Assessment & Plan Note (Signed)
We discussed multidisciplinary approach to management of locally advanced tonsil cancer. 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 Given his baseline hearing loss, I will recommend concurrent treatment with cetuximab instead of cisplatin. He would need placement of feeding tube and port prior to the start of treatment as well as attendance to chemotherapy teaching class, referral to nutritionist, speech and language therapist, physical therapist and dentist evaluation. The patient desire second opinion at [facility] and he will be referred there prior to initiation of treatment. I will wait to hear back from him before proceeding with plan as outlined above

## 2015-08-17 NOTE — Addendum Note (Signed)
Encounter addended by: Ernst Spell, RN on: 08/17/2015  3:17 PM<BR>     Documentation filed: Charges VN

## 2015-08-17 NOTE — Progress Notes (Signed)
  Oncology Nurse Navigator Documentation  Navigator Location: [facility] (08/17/15 1020) Navigator Encounter Type: Initial MedOnc (08/17/15 1020)               Barriers/Navigation Needs: Education (08/17/15 1020)       Joined patient and his wife during initial consult with Dr. Alvy Bimler.   He voiced understanding of:  The benefits of including infusion therapy as part of his treatment plan.  Proposed use of Erbitux d/t current hearing defecit.  Recommendation for baseline audiology. Following consult, I showed them the location of Dr. Ritta Slot office and explained arrival procedure for his appt next Tuesday morning.  They voiced understanding of the location of [facility].     They verbalized understanding of information provided. I encouraged them to call with questions/concerns, they verbalized understanding.  Gayleen Orem, RN, BSN, [place] at [place] [phone]                       Time Spent with Patient: 75 (08/17/15 1020)

## 2015-08-17 NOTE — Assessment & Plan Note (Signed)
For this reason, I will not prescribe cisplatin. I recommend baseline hearing test before we proceed with treatment

## 2015-08-17 NOTE — Telephone Encounter (Signed)
CALLED PATIENT TO INFORM OF APPT. WITH DR. Nicolette Bang IN [place] ON 08-29-15- ARRIVAL TIME - 10 AM, LVM FOR A RETURN CALL

## 2015-08-20 ENCOUNTER — Other Ambulatory Visit (HOSPITAL_COMMUNITY): Payer: 59 | Admitting: Dentistry

## 2015-08-21 ENCOUNTER — Ambulatory Visit (HOSPITAL_COMMUNITY): Payer: Self-pay | Admitting: Dentistry

## 2015-08-21 ENCOUNTER — Encounter (HOSPITAL_COMMUNITY): Payer: Self-pay | Admitting: Dentistry

## 2015-08-21 VITALS — BP 136/84 | HR 72 | Temp 98.7°F

## 2015-08-21 DIAGNOSIS — K030 Excessive attrition of teeth: Secondary | ICD-10-CM

## 2015-08-21 DIAGNOSIS — K031 Abrasion of teeth: Secondary | ICD-10-CM

## 2015-08-21 DIAGNOSIS — J392 Other diseases of pharynx: Secondary | ICD-10-CM

## 2015-08-21 DIAGNOSIS — K036 Deposits [accretions] on teeth: Secondary | ICD-10-CM

## 2015-08-21 DIAGNOSIS — M270 Developmental disorders of jaws: Secondary | ICD-10-CM

## 2015-08-21 DIAGNOSIS — K08409 Partial loss of teeth, unspecified cause, unspecified class: Secondary | ICD-10-CM

## 2015-08-21 DIAGNOSIS — K060 Gingival recession: Secondary | ICD-10-CM

## 2015-08-21 DIAGNOSIS — C099 Malignant neoplasm of tonsil, unspecified: Secondary | ICD-10-CM

## 2015-08-21 DIAGNOSIS — IMO0002 Reserved for concepts with insufficient information to code with codable children: Secondary | ICD-10-CM

## 2015-08-21 DIAGNOSIS — Z01818 Encounter for other preprocedural examination: Secondary | ICD-10-CM

## 2015-08-21 DIAGNOSIS — K0530 Chronic periodontitis, unspecified: Secondary | ICD-10-CM

## 2015-08-21 NOTE — Progress Notes (Signed)
DENTAL CONSULTATION  Date of Consultation:  08/21/2015 Patient Name:   Dwayne Williams Date of Birth:   08-09-1955 Medical Record Number: [id number]  VITALS: BP 136/84 mmHg  Pulse 72  Temp(Src) 98.7 F (37.1 C) (Oral)  CHIEF COMPLAINT: Patient referred by Dr. 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 Patient with possible future TORS at [facility] with Dr. Nicolette Bang. Patient with possible chemoradiation therapy. Patient is now seen as part of a medically necessary pre-chemoradiation therapy dental protocol examination.  The patient currently denies acute toothaches, swellings, or abscesses. Patient was last seen on 08/14/2015 for an exam and cleaning. Patient is seen on an every 6 month basis by Dr. Dannielle Burn in [place], [place]. Patient denies having any unmet dental needs at this time. Patient has a history of multiple extractions. Patient has had previous orthodontic therapy as an adolescent.  PROBLEM LIST: Patient Active Problem List   Diagnosis Date Noted  . 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 Hearing loss of both ears 08/17/2015  . Chronic pain disorder 08/17/2015    PMH: Past Medical History  Diagnosis Date  . Hypothyroidism   . Hypertension   . Cervical lymphadenopathy   . Diabetes mellitus without complication ([place])   . Cancer ([place])     Right Tonsil per PET scan  . Depression   . H/O degenerative disc disease     chronic back pain  . Migraines    PSH: Past Surgical History  Procedure Laterality Date  . Tooth extraction    . Tonsillectomy  age 33  . Lithotripsy  X 2   ALLERGIES: Allergies  Allergen Reactions  . Latex Itching  .  Penicillins Itching    MEDICATIONS: Current Outpatient Prescriptions  Medication Sig Dispense Refill  . AMLODIPINE BESYLATE PO Take 10 mg by mouth daily.    . finasteride (PROPECIA) 1 MG tablet     . levothyroxine (SYNTHROID, LEVOTHROID) 25 MCG tablet Take 25 mcg by mouth daily before breakfast.    . losartan (COZAAR) 100 MG tablet Take 100 mg by mouth daily.    . metFORMIN (GLUCOPHAGE) 500 MG tablet Take by mouth 2 (two) times daily with a meal.    . oxyCODONE-acetaminophen (PERCOCET) 10-325 MG tablet Take 1 tablet by mouth every 4 (four) hours as needed for pain.    . rizatriptan (MAXALT-MLT) 10 MG disintegrating tablet Take 10 mg by mouth as needed for migraine. May repeat in 2 hours if needed    . venlafaxine XR (EFFEXOR-XR) 150 MG 24 hr capsule Take 150 mg by mouth daily with breakfast.     No current facility-administered medications for this visit.    LABS: No results found for: WBC, HGB, HCT, MCV, PLT No results found for: NA, K, CL, CO2, GLUCOSE, BUN, CREATININE, CALCIUM, GFRNONAA, GFRAA No results found for: INR, PROTIME No results found for: PTT  SOCIAL HISTORY: Social History   Social History  . Marital Status: Married    Spouse Name: N/A  . Number of Children: 0  . Years of Education: N/A   Occupational History  . Not on file.   Social History Main Topics  . Smoking status: Never Smoker   . Smokeless tobacco:  Never Used  . Alcohol Use: No  . Drug Use: No  . 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 Not on file   Social History Narrative     FAMILY HISTORY: Family History  Problem Relation Age of Onset  . Cancer Father     kidney ca  . Cancer Brother     throat ca  . Cancer Maternal Uncle     melanoma  . Cancer Paternal Uncle     brain ca    REVIEW OF SYSTEMS: Reviewed ROS from Dr. Alvy Bimler note of 3/317 with patient with no changes noted. Constitutional: Denies  fevers, chills or abnormal night sweats Eyes: Denies blurriness of vision, double vision or watery eyes Ears, nose, mouth, throat, and face: Denies mucositis or sore throat Respiratory: Denies cough, dyspnea or wheezes Cardiovascular: Denies palpitation, chest discomfort or lower extremity swelling Gastrointestinal: Denies nausea, heartburn or change in bowel habits Skin: Denies abnormal skin rashes Neurological:Denies numbness, tingling or new weaknesses Behavioral/Psych: Mood is stable, no new changes. Denies dental phobia.  All other systems were reviewed with the patient and are negative.   DENTAL HISTORY: CHIEF COMPLAINT: Patient referred by Dr. 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 Patient with possible future TORS at [facility] with Dr. Nicolette Bang. Patient with possible chemoradiation therapy. Patient is now seen as part of a medically necessary pre-chemoradiation therapy dental protocol examination.  The patient currently denies acute toothaches, swellings, or abscesses. Patient was last seen on 08/14/2015 for an exam and cleaning. Patient is seen on an every 6 month basis by Dr. Dannielle Burn in [place]. Patient denies having any unmet dental needs at this time. Patient has a history of multiple extractions. Patient has had previous orthodontic therapy as a adolescent.  DENTAL EXAMINATION: GENERAL: Patient is a well-developed, well-nourished male in no acute distress. HEAD AND NECK: Right neck mass consistent with cancer diagnosis. No left neck lymphadenopathy palpated. Patient denies acute TMJ symptoms and has a maximum interincisal opening of 50 mm. No thyroid masses palpated. INTRAORAL EXAM: Patient has normal  saliva. Patient has bilateral mandibular lingual tori. Patient has a mandibular anterior incisal attrition as charted. There is no evidence of oral abscess formation.  DENTITION: Patient is missing tooth numbers 1, 5, 13, 16, 17, and 32. Spaces between the maxillary premolars were closed secondary to orthodontic therapy. Multiple diastemas are noted between tooth numbers 10/11 and 11/12. PERIODONTAL: Patient with chronic periodontitis with minimal plaque accumulations, selective areas of gingival recession, and no significant tooth mobility. Minimal bleeding on probing with comprehensive periodontal charting. DENTAL CARIES/SUBOPTIMAL RESTORATIONS: No obvious dental caries noted. Multiple flexure lesions were noted. Possible lost resin restoration on the facial aspect of tooth #9. This could be evaluated for a resin restoration by his primary dentist as indicated. ENDODONTIC: Patient currently denies acute pulpitis symptoms. There is no evidence of periapical pathology or radiolucency. Patient has had a previous root canal therapy associated with tooth #2 with no obvious persistent periapical pathology or radiolucency. CROWN AND BRIDGE: Patient has a PFM crown on tooth #3. PROSTHODONTIC: No partial dentures. OCCLUSION: Patient has a stable occlusion at this time. Patient has had previous orthodontic therapy as  an adolescent (ages [age] by patient report).  RADIOGRAPHIC INTERPRETATION: An orthopantogram was obtained. Patient would not allow periapical radiographs or bitewings secondary to hyperactive gag reflex. There are multiple missing teeth. There is incipient bone loss noted. Multiple diastemas are noted. There is a previous root canal therapy and a crown on tooth #2 with no obvious persistent periapical radiolucency. Multiple resin restorations are noted. There are mandibular radiopacities consistent with bilateral mandibular lingual tori. Bilateral maxillary sinuses are noted and appear to be well  aerated.  ASSESSMENTS: 1. 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 Pre-chemoradiation therapy dental protocol examination 3. Chronic periodontitis with incipient bone loss 4. Gingival recession 5. Accretions-minimal 6. Multiple abfraction lesions 7. Mandibular anterior incisal attrition 8. History of multiple missing teeth 9. History of orthodontic therapy as an adolescent with closure of spaces between premolars/molars 10. Diastemas between tooth numbers 10/11 and 11/12. 11. Bilateral mandibular lingual tori 12. Stable occlusion at this time  PLAN/RECOMMENDATIONS: 1. I discussed the risks, benefits, and complications of various treatment options with the patient in relationship to his medical and dental conditions, possible chemoradiation therapy, and chemoradiation therapy side effects to include xerostomia, radiation caries, trismus, mucositis, taste changes, gum and jawbone changes, and risk for infection and osteoradionecrosis. We discussed various treatment options to include no treatment, extraction of teeth in primary field of radiation therapy, pre-prosthetic surgery as indicated, periodontal therapy, dental restorations, root canal therapy, crown and bridge therapy, implant therapy, and replacement of missing teeth as indicated. We also discussed impressions today for the fabrication of fluoride trays and scatter guards. The patient currently wishes to proceed with impressions today for the future fabrication of fluoride trays and scatter guards if indicated.  Patient ideally does NOT wish to proceed with dental extractions at this time and it appears that posterior molars are not in the primary field of radiation therapy.  Patient with pending consultation with Dr. Nicolette Bang at [facility] for evaluation for possible TORS and neck dissection on 08/29/2015 per patient report.  Patient has had a recent dental cleaning with his primary dentist on 08/14/2015 but may  follow-up with him for evaluation for a resin restoration on tooth #9 if indicated.  2. Discussion of findings with medical team and coordination of future medical and dental care as needed.  I spent in excess of  120 minutes during the conduct of this consultation and >50% of this time involved direct face-to-face encounter for counseling and/or coordination of the patient's care.    Lenn Cal, DDS

## 2015-08-21 NOTE — Patient Instructions (Signed)

## 2015-08-23 ENCOUNTER — Telehealth: Payer: Self-pay | Admitting: *Deleted

## 2015-08-23 NOTE — Telephone Encounter (Signed)
  Oncology Nurse Navigator Documentation  Navigator Location: [facility] On[facility] (08/23/15 1519) Navigator Encounter Type: Telephone (08/23/15 1519) Telephone: Incoming Call;Diagnostic Results;Patient Update (08/23/15 1519)       Mr. Belmore called to request a disc with copies of recent diagnostic scans that he can take to his surgical consultation at [facility] next Wednesday, 3/15.  I informed him I would place request to [facility], call him when disc available for pick-up.  I later sent email request to Maryclare Labrador, [facility].  He also informed that he is expecting to move forward with XRT regardless of next week's consultation.  He further indicated he does not want to include chemotherapy as part of his treatment.  I encouraged him to call me after next week's consultation to arrange follow-up with Dr. Isidore Moos.  Gayleen Orem, RN, BSN, [place] at [place] [phone]                                     Time Spent with Patient: 30 (08/23/15 1519)

## 2015-08-31 ENCOUNTER — Other Ambulatory Visit: Payer: Self-pay | Admitting: Hematology and Oncology

## 2015-10-03 ENCOUNTER — Encounter: Payer: Self-pay | Admitting: *Deleted

## 2015-10-03 NOTE — Progress Notes (Signed)
[place] Psychosocial Distress Screening Clinical Social Work  Clinical Social Work was referred by radiation oncologist and distress screening protocol.  The patient scored a 5 on the Psychosocial Distress Thermometer which indicates moderate distress. Clinical Social Worker attempted to contact patient by phone to assess for distress and other psychosocial needs.   ONCBCN DISTRESS SCREENING 08/17/2015  Screening Type Initial Screening  Distress experienced in past week (1-10) 5  Family Problem type Partner  Emotional problem type Depression;Adjusting to appearance changes  Information Concerns Type Lack of info about treatment;Lack of info about complementary therapy choices  Physical Problem type (No Data)  Physician notified of physical symptoms Yes    Clinical Social Worker follow up needed: CSW left voicemail requesting patient return call.  Polo Riley, MSW, LCSW, OSW-C Clinical Social Worker [facility] [phone]

## 2015-10-30 ENCOUNTER — Telehealth: Payer: Self-pay | Admitting: *Deleted

## 2015-10-30 NOTE — Telephone Encounter (Signed)
  Oncology Nurse Navigator Documentation  Navigator Location: [facility] On[facility] (10/30/15 0812) Navigator Encounter Type: Letter/Fax/Email (10/30/15 [id number])               Barriers/Navigation Needs: Coordination of Care (10/30/15 [id number])   Interventions: Coordination of Care (10/30/15 [id number])            Acuity: Level 1 (10/30/15 [id number])       Sent e-mail to Dwayne Williams, [place], [facility], regarding her 5/8 Care Everywhere progress note indication patient will need post-surgical adjuvant treatment, CC to Drs. Francina Ames and [place].  Reply to all from Dr. Nicolette Bang indicates patient will need chemoRT as well as dental evaluation.   Pending further update from [facility] and discussion with Dr. Isidore Moos, I will contact patient and coordinate appropriate appts.  Dwayne Orem, RN, BSN, [place] at [place] [phone]      Time Spent with Patient: 30 (10/30/15 [id number])

## 2015-10-31 ENCOUNTER — Other Ambulatory Visit: Payer: Self-pay | Admitting: Hematology and Oncology

## 2015-10-31 ENCOUNTER — Telehealth: Payer: Self-pay | Admitting: *Deleted

## 2015-10-31 NOTE — Telephone Encounter (Signed)
  Oncology Nurse Navigator Documentation  Navigator Location: [facility] (10/31/15 1037) Navigator Encounter Type: Telephone (10/31/15 1037) Telephone: [place] Call (10/31/15 1037) Abnormal Finding Date: 07/12/15 (10/31/15 1037) Confirmed Diagnosis Date: 07/23/15 (10/31/15 1037) Surgery Date: 10/15/15 (10/31/15 1037)     Treatment Phase: Post-Tx Follow-up (10/31/15 1037)     Interventions: Coordination of Care (10/31/15 1037)      Called patient to follow-up on [facility] Dr. Servando Salina recommendation that he have post-surgical adjuvant chemoradiation. LVMM asking him to call me to arrange appts.  Gayleen Orem, RN, BSN, [place] at [place] [phone]                   Time Spent with Patient: 15 (10/31/15 1037)

## 2015-10-31 NOTE — Telephone Encounter (Signed)
  Oncology Nurse Navigator Documentation  Navigator Location: [facility] (10/31/15 1748) Navigator Encounter Type: Telephone (10/31/15 1748) Telephone: Jerilee Hoh Confirmation/Clarification (10/31/15 1748)                     Left 2nd VMM on patient's home phone asking him to return call to arrange appts for post-TORS chemoradiation.  Unable to leave message on mobile.  Gayleen Orem, RN, BSN, [place] at [place] [phone]                       Time Spent with Patient: 15 (10/31/15 1748)

## 2015-11-01 ENCOUNTER — Telehealth: Payer: Self-pay | Admitting: *Deleted

## 2015-11-01 ENCOUNTER — Telehealth (HOSPITAL_COMMUNITY): Payer: Self-pay | Admitting: Dentistry

## 2015-11-01 NOTE — Telephone Encounter (Signed)
  Oncology Nurse Navigator Documentation  Navigator Location: [facility] (11/01/15 1025) Navigator Encounter Type: Telephone (11/01/15 1025) Telephone: Outgoing Call (11/01/15 1025)                 Interventions: Coordination of Care (11/01/15 1025)     After third unsuccessful attempt to reach Mr. Gaye, reached his wife at work.    I explained it was our understanding Dr. Nicolette Bang recommended he receive post-surgical chemoradiation, that I had been trying to reach [facility] to coordinate appointments for him to be seen by Drs. Isidore Moos and [place].  She voiced understanding of Dr. Servando Salina recommendation, indicated that he was "50% sure" he would proceed with adjuvant treatments.   I explained our goal was to initiate treatment within 6 weeks following surgery.  I provided her tentative appts of 11/16/15 to see Dr. Isidore Moos and 11/27/15 for radiation planning.  I indicated I would try to coordinate an appt with Dr. Alvy Bimler on 11/16/15 as well. We discussed his follow-up with Dr. Enrique Sack for fabrication of scatter guards.  She indicated she forward this information to [facility], have him call me.  Gayleen Orem, RN, BSN, [place] at [place] [phone]                     Time Spent with Patient: 15 (11/01/15 1025)

## 2015-11-01 NOTE — Telephone Encounter (Signed)
  Oncology Nurse Navigator Documentation  Navigator Location: [facility] (11/01/15 1651) Navigator Encounter Type: Telephone (11/01/15 1651) Telephone: Incoming Call (11/01/15 1651)             Barriers/Navigation Needs: Education (11/01/15 1651) Education: Understanding Cancer/ Treatment Options (11/01/15 1651)       Dwayne Williams returned by call, provided update regarding his treatment decision.   He indicated he is looking into proton beam therapy for post-surgical adjuvant treatment rather than chemoradiation  recommended by his surgeon Dr. Nicolette Bang [facility].  I shared case discussion points from Wednesday's H&N Conference including Dr. Pearlie Oyster indication she would prescribe 6 weeks of XRT vs the 7 weeks originally discussed during his 08/17/15 consult.  I noted that Dr. Enrique Sack Dental Medicine would fabricate a scatter guard to minimize incidental exposure to mouth. I indicated I would inform Drs. Herbie Drape and [place] of his decision. He expressed appreciation for my call, provided assurance he would contact me if he alters his treatment decision.  Gayleen Orem, RN, BSN, [place] at [place] [phone]                     Time Spent with Patient: 30 (11/01/15 1651)

## 2015-11-01 NOTE — Telephone Encounter (Signed)
11/01/2015  Patient:            Dwayne Williams Date of Birth:  [birth year] MRN:                [id number]   I [facility] for patient to call and schedule follow up dental evaluation prior to radiation therapy.  Lenn Cal, DDS

## 2017-01-22 ENCOUNTER — Ambulatory Visit: Payer: 59 | Admitting: Radiation Oncology

## 2017-01-23 ENCOUNTER — Telehealth: Payer: Self-pay | Admitting: *Deleted

## 2017-01-23 ENCOUNTER — Encounter (INDEPENDENT_AMBULATORY_CARE_PROVIDER_SITE_OTHER): Payer: Self-pay

## 2017-01-23 ENCOUNTER — Other Ambulatory Visit: Payer: Self-pay | Admitting: *Deleted

## 2017-01-23 ENCOUNTER — Ambulatory Visit
Admission: RE | Admit: 2017-01-23 | Discharge: 2017-01-23 | Disposition: A | Discharge status: Home / Self Care | Payer: 59 | Source: Ambulatory Visit | Attending: Radiation Oncology | Admitting: Radiation Oncology

## 2017-01-23 DIAGNOSIS — E119 Type 2 diabetes mellitus without complications: Secondary | ICD-10-CM | POA: Insufficient documentation

## 2017-01-23 DIAGNOSIS — F329 Major depressive disorder, single episode, unspecified: Secondary | ICD-10-CM | POA: Insufficient documentation

## 2017-01-23 DIAGNOSIS — Z79899 Other long term (current) drug therapy: Secondary | ICD-10-CM | POA: Insufficient documentation

## 2017-01-23 DIAGNOSIS — E039 Hypothyroidism, unspecified: Secondary | ICD-10-CM | POA: Diagnosis not present

## 2017-01-23 DIAGNOSIS — Z7984 Long term (current) use of oral hypoglycemic drugs: Secondary | ICD-10-CM | POA: Insufficient documentation

## 2017-01-23 DIAGNOSIS — C770 Secondary and unspecified malignant neoplasm of lymph nodes of head, face and neck: Secondary | ICD-10-CM

## 2017-01-23 DIAGNOSIS — I10 Essential (primary) hypertension: Secondary | ICD-10-CM | POA: Insufficient documentation

## 2017-01-23 DIAGNOSIS — Z809 Family history of malignant neoplasm, unspecified: Secondary | ICD-10-CM | POA: Insufficient documentation

## 2017-01-23 DIAGNOSIS — C099 Malignant neoplasm of tonsil, unspecified: Secondary | ICD-10-CM | POA: Insufficient documentation

## 2017-01-23 NOTE — Consult Note (Signed)
NEW PATIENT EVALUATION  Name: Dwayne Williams  MRN: [id number]  Date:   01/23/2017     DOB: 1955-11-09   This 61 y.o. 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  REFERRING PHYSICIAN: No ref. provider found  CHIEF COMPLAINT: No chief complaint on file.   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   PREVIOUS INVESTIGATIONS:  Pathology report reviewed CT scans reviewed PET CT scan ordered Clinical notes reviewed  HPI: patient is a 61 year old male originally presented back in March 2017 withstage for a (T1 N2 B M0) right tonsillar squamous cell carcinoma.patient at that time had hypermetabolic activity on PET CT scan performed February 2017 in the area of the right tonsil and adenopathy in the right neck. He was offered concurrent chemoradiation although the patient is an advocate of alternative medicine declined that treatment went on to [facility] and hadTORS surgery for squamous cell carcinoma the right also tonsillar sulcus. Pathology showed a 1.5 cm tumor with negative margins. There was perineural invasion and lymphovascular invasion. 5 of 25 lymph nodes in the right neck were positive for metastatic disease with  extracapsular spread. He was offered and recommended for adjuvant chemoradiation although has declined. Surgery was done back 10/15/2015. He has gone on to present with some occasional neck pain no dysphagia. He recently had a CT scan showing new mass within the right tonsillectomy bed encasing the right external carotid artery. There was also interval pathologic enlargement of right lateral retropharyngeal nodes measuring 1.6 cm worrisome for nodal metastatic disease. He also had interval increase of right level Ib node. CT-guided biopsy was positive for squamous cell carcinoma. He is now seen on self-referral for consideration of radiation therapy. Patient adamantly  declines chemotherapy intervention. He is scheduled to start hyperbaric oxygen as well as vitamin C infusions all with the intent to control his disease.  PLANNED TREATMENT REGIMEN: I MRT radiation therapy with possible concurrent chemotherapy  PAST MEDICAL HISTORY:  has a past medical history of Cancer ([place]); Cervical lymphadenopathy; Depression; Diabetes mellitus without complication ([place]); H/O degenerative disc disease; Hypertension; Hypothyroidism; and Migraines.    PAST SURGICAL HISTORY:  Past Surgical History:  Procedure Laterality Date  . LITHOTRIPSY  X 2  . TONSILLECTOMY  age 71  . TOOTH EXTRACTION      FAMILY HISTORY: family history includes Cancer in his brother, father, maternal uncle, and paternal uncle.  SOCIAL HISTORY:  reports that he has never smoked. He has never used smokeless tobacco. He reports that he does not drink alcohol or use drugs.  ALLERGIES: Codeine; Etodolac; Latex; Penicillins; Sertraline; Topiramate; and Zolpidem  MEDICATIONS:  Current Outpatient Prescriptions  Medication Sig Dispense Refill  . AMLODIPINE BESYLATE PO Take 10 mg by mouth daily.    . cloNIDine (CATAPRES) 0.1 MG tablet     . finasteride (PROPECIA) 1 MG tablet     . levothyroxine (SYNTHROID, LEVOTHROID) 25 MCG tablet Take 25 mcg by mouth daily before breakfast.    . losartan (COZAAR) 100 MG tablet Take 100 mg by mouth daily.    . metFORMIN (GLUCOPHAGE) 500 MG tablet Take by mouth 2 (two) times daily with a meal.    . oxyCODONE-acetaminophen (PERCOCET) 10-325 MG tablet Take 1 tablet by mouth every 4 (four) hours as needed for pain.    Marland Kitchen oxyCODONE-acetaminophen (PERCOCET/ROXICET) 5-325 MG tablet     . rizatriptan (MAXALT-MLT) 10 MG disintegrating tablet Take 10  mg by mouth as needed for migraine. May repeat in 2 hours if needed    . telmisartan (MICARDIS) 40 MG tablet     . traZODone (DESYREL) 50 MG tablet     . venlafaxine XR (EFFEXOR-XR) 150 MG 24 hr capsule Take 150 mg by mouth daily with  breakfast.     No current facility-administered medications for this encounter.     ECOG PERFORMANCE STATUS:  1 - Symptomatic but completely ambulatory  REVIEW OF SYSTEMS:  Patient denies any weight loss, fatigue, weakness, fever, chills or night sweats. Patient denies any loss of vision, blurred vision. Patient denies any ringing  of the ears or hearing loss. No irregular heartbeat. Patient denies heart murmur or history of fainting. Patient denies any chest pain or pain radiating to her upper extremities. Patient denies any shortness of breath, difficulty breathing at night, cough or hemoptysis. Patient denies any swelling in the lower legs. Patient denies any nausea vomiting, vomiting of blood, or coffee ground material in the vomitus. Patient denies any stomach pain. Patient states has had normal bowel movements no significant constipation or diarrhea. Patient denies any dysuria, hematuria or significant nocturia. Patient denies any problems walking, swelling in the joints or loss of balance. Patient denies any skin changes, loss of hair or loss of weight. Patient denies any excessive worrying or anxiety or significant depression. Patient denies any problems with insomnia. Patient denies excessive thirst, polyuria, polydipsia. Patient denies any swollen glands, patient denies easy bruising or easy bleeding. Patient denies any recent infections, allergies or URI. Patient "s visual fields have not changed significantly in recent time.    PHYSICAL EXAM: There were no vitals taken for this visit. Oral cavity is clear teeth are in good state of repair. Upper airways clear vallecula and base of tongue appear normal limits. Tonsillar region looks within normal limits. Neck is clear without evidence of subject gastric cervical or supraclavicular adenopathy. Well-developed well-nourished patient in NAD. HEENT reveals PERLA, EOMI, discs not visualized.  Oral cavity is clear. No oral mucosal lesions are  identified. Neck is clear without evidence of cervical or supraclavicular adenopathy. Lungs are clear to A&P. Cardiac examination is essentially unremarkable with regular rate and rhythm without murmur rub or thrill. Abdomen is benign with no organomegaly or masses noted. Motor sensory and DTR levels are equal and symmetric in the upper and lower extremities. Cranial nerves II through XII are grossly intact. Proprioception is intact. No peripheral adenopathy or edema is identified. No motor or sensory levels are noted. Crude visual fields are within normal range.  LABORATORY DATA: pathology reports reviewed    RADIOLOGY RESULTS:CT scan is reviewed and compatible with the above-stated findings PET CT scan has been ordered   IMPRESSION: recurrent locally advanced head and neck cancer in 61 year old male  PLAN: this time of ordered a pets CT scan to clearly delineate the extent of recurrent disease as well as possibility of distant metastasis. I've had a long discussion with the patient and his wife about treating this with radiation therapy. I told him I would prefer to use concurrent chemotherapy and will refer him to medical oncology for opinion. I've explained to him the risks and benefits of radiation therapy including increasing possible dysphasia sore throat dryness of the mouth alteration of taste skin reaction and fatigue. I've set him up for follow-up to discuss his PET/CT scan will make further discussions about treatment planning at that time. I also will set up appointment with medical oncology for  opinion. Patient wife both seem to comprehend my treatment plan well.  I would like to take this opportunity to thank you for allowing me to participate in the care of your patient.Armstead Peaks., MD

## 2017-01-23 NOTE — Telephone Encounter (Signed)
Voice mail message left with appointments for PET scan and Dr. Baruch Gouty and Dr. Jacinto Reap.  Instructions for PET scan were also given.  Told patient to call back to confirm he received message.

## 2017-01-27 ENCOUNTER — Ambulatory Visit: Payer: 59

## 2017-01-30 ENCOUNTER — Ambulatory Visit: Payer: 59 | Admitting: Radiation Oncology

## 2017-01-30 ENCOUNTER — Inpatient Hospital Stay: Payer: 59 | Admitting: Internal Medicine

## 2017-02-03 ENCOUNTER — Telehealth: Payer: Self-pay | Admitting: *Deleted

## 2017-02-03 ENCOUNTER — Ambulatory Visit
Admission: RE | Admit: 2017-02-03 | Discharge: 2017-02-03 | Disposition: A | Discharge status: Home / Self Care | Payer: 59 | Source: Ambulatory Visit | Attending: Radiation Oncology | Admitting: Radiation Oncology

## 2017-02-03 DIAGNOSIS — K760 Fatty (change of) liver, not elsewhere classified: Secondary | ICD-10-CM | POA: Insufficient documentation

## 2017-02-03 DIAGNOSIS — I700 Atherosclerosis of aorta: Secondary | ICD-10-CM | POA: Diagnosis not present

## 2017-02-03 DIAGNOSIS — N200 Calculus of kidney: Secondary | ICD-10-CM | POA: Diagnosis not present

## 2017-02-03 DIAGNOSIS — K5730 Diverticulosis of large intestine without perforation or abscess without bleeding: Secondary | ICD-10-CM | POA: Insufficient documentation

## 2017-02-03 DIAGNOSIS — R911 Solitary pulmonary nodule: Secondary | ICD-10-CM | POA: Diagnosis not present

## 2017-02-03 DIAGNOSIS — K5790 Diverticulosis of intestine, part unspecified, without perforation or abscess without bleeding: Secondary | ICD-10-CM | POA: Insufficient documentation

## 2017-02-03 DIAGNOSIS — C099 Malignant neoplasm of tonsil, unspecified: Secondary | ICD-10-CM | POA: Diagnosis present

## 2017-02-03 DIAGNOSIS — J320 Chronic maxillary sinusitis: Secondary | ICD-10-CM | POA: Diagnosis not present

## 2017-02-03 LAB — GLUCOSE, CAPILLARY: Glucose-Capillary: 233 mg/dL — ABNORMAL HIGH (ref 65–99)

## 2017-02-03 NOTE — Telephone Encounter (Signed)
Dwayne Williams notified of rescheduled PET scan appointment.  Instructions given to resume his metformin and hold the dose the morning of the PET scan.  He was also given his new appointments with Dr. Jacinto Reap and Dr. Baruch Gouty. [name] acknowledged the appointments and read back the dates and times.

## 2017-02-03 NOTE — Telephone Encounter (Addendum)
Dwayne Muckle, MD; Cammie Sickle, MD  Cc: Sabino Gasser, RN; Dwayne Williams; Dixon, Winfield Rast, Teena Dunk, [place], RN;         FYI   PET was rescheduled to 02/11/17 at 9:30 am. Can someone Clinical please give patient a call in regards to starting his Diabetes medication? Per [facility] Scheduling, he will be required to start taking his medication from today and to hold off on taking it, the day of his PET appointment and that he can bring the medication to his appointment to take after his Scan. Please note that patient stated at today's PET appointment that he had stopped taking his Diabetes medication.   Dr B (per [place].) would let Dr Baruch Gouty decide on what to do in regards to scheduling appts, then he would work his schedule around Dr. Baruch Gouty schedule.Marland Kitchen    Thanks,  Merleen Nicely      ----- Message from Dwayne Williams sent at 02/03/2017  2:25 PM EDT ----- Regarding: FW: Elevated Glucose/Rschd PET SCAN   ----- Message ----- From: Sabino Gasser, RN Sent: 02/03/2017  12:39 PM To: Noreene Filbert, MD, [place], # Subject: RE: Elevated Glucose/Rschd PET SCAN            Dr. B said that he would let Dr. Massie Maroon decide what to do in regards to scheduling apts., then we would work our schedule around Dr. Amanda Cockayne'  ----- Message ----- From: Dwayne Williams Sent: 02/03/2017  11:48 AM To: Noreene Filbert, MD, [place], # Subject: Elevated Glucose/Rschd PET SCAN                FYI:   Per Vinnie Level @ [facility], The above mentioned patient PET scheduled for today was cancelled due to an elevated Glucose of 238 and that it has to be 200 or lower. Vinnie Level also stated that the patient said that he has been off his Diabetes medication for a while. Patient was told by Vinnie Level that he will need to start taking his Diabetes meds to contro/lower his Blood Glucose for about 1 week, before rescheduling before being able to have his PET  and that it should not be scheduled before 02/16/17. ( to give the meds time to work)  ##Patient's Rad/Onc appt will need to be rescheduled after PET has been done.  ##New Pt appt with Dr B, will need to be rescheduled as well. Please note that the next available appt for a New pt Consult with Dr B is not until 02/23/17. Please advise....   Thanks, Merleen Nicely

## 2017-02-03 NOTE — Telephone Encounter (Signed)
Spoke with Dr. Rogue Bussing - patient's pcp needs to manage blood sugar/diabetic meds.   Dwayne Males, RN in radiation therapy to contact pt to review this with patient. Dr. B agreed to see patient on 02/19/17 at 830 am

## 2017-02-05 ENCOUNTER — Ambulatory Visit: Payer: 59 | Admitting: Radiation Oncology

## 2017-02-05 ENCOUNTER — Inpatient Hospital Stay: Payer: 59 | Admitting: Internal Medicine

## 2017-02-11 ENCOUNTER — Ambulatory Visit
Admission: RE | Admit: 2017-02-11 | Discharge: 2017-02-11 | Disposition: A | Discharge status: Home / Self Care | Payer: 59 | Source: Ambulatory Visit | Attending: Radiation Oncology | Admitting: Radiation Oncology

## 2017-02-11 DIAGNOSIS — C099 Malignant neoplasm of tonsil, unspecified: Secondary | ICD-10-CM | POA: Insufficient documentation

## 2017-02-11 DIAGNOSIS — K760 Fatty (change of) liver, not elsewhere classified: Secondary | ICD-10-CM | POA: Diagnosis not present

## 2017-02-11 DIAGNOSIS — N200 Calculus of kidney: Secondary | ICD-10-CM | POA: Diagnosis not present

## 2017-02-11 DIAGNOSIS — J320 Chronic maxillary sinusitis: Secondary | ICD-10-CM | POA: Insufficient documentation

## 2017-02-11 DIAGNOSIS — K5730 Diverticulosis of large intestine without perforation or abscess without bleeding: Secondary | ICD-10-CM | POA: Insufficient documentation

## 2017-02-11 DIAGNOSIS — I700 Atherosclerosis of aorta: Secondary | ICD-10-CM | POA: Insufficient documentation

## 2017-02-11 DIAGNOSIS — R911 Solitary pulmonary nodule: Secondary | ICD-10-CM | POA: Insufficient documentation

## 2017-02-11 LAB — GLUCOSE, CAPILLARY: Glucose-Capillary: 181 mg/dL — ABNORMAL HIGH (ref 65–99)

## 2017-02-11 MED ORDER — FLUDEOXYGLUCOSE F - 18 (FDG) INJECTION
12.7100 | Freq: Once | INTRAVENOUS | Status: AC | PRN
  Administered 2017-02-11: 12.71 via INTRAVENOUS

## 2017-02-19 ENCOUNTER — Ambulatory Visit
Admission: RE | Admit: 2017-02-19 | Discharge: 2017-02-19 | Disposition: A | Discharge status: Home / Self Care | Payer: 59 | Source: Ambulatory Visit | Attending: Radiation Oncology | Admitting: Radiation Oncology

## 2017-02-19 ENCOUNTER — Inpatient Hospital Stay: Payer: 59 | Attending: Internal Medicine | Admitting: Internal Medicine

## 2017-02-19 VITALS — BP 170/90 | HR 62 | Temp 97.8°F | Resp 20 | Ht 69.5 in | Wt 207.8 lb

## 2017-02-19 DIAGNOSIS — M549 Dorsalgia, unspecified: Secondary | ICD-10-CM | POA: Insufficient documentation

## 2017-02-19 DIAGNOSIS — K5730 Diverticulosis of large intestine without perforation or abscess without bleeding: Secondary | ICD-10-CM | POA: Insufficient documentation

## 2017-02-19 DIAGNOSIS — Z8051 Family history of malignant neoplasm of kidney: Secondary | ICD-10-CM | POA: Insufficient documentation

## 2017-02-19 DIAGNOSIS — K760 Fatty (change of) liver, not elsewhere classified: Secondary | ICD-10-CM | POA: Diagnosis not present

## 2017-02-19 DIAGNOSIS — C099 Malignant neoplasm of tonsil, unspecified: Secondary | ICD-10-CM | POA: Diagnosis not present

## 2017-02-19 DIAGNOSIS — Z923 Personal history of irradiation: Secondary | ICD-10-CM | POA: Diagnosis not present

## 2017-02-19 DIAGNOSIS — G8929 Other chronic pain: Secondary | ICD-10-CM | POA: Insufficient documentation

## 2017-02-19 DIAGNOSIS — E119 Type 2 diabetes mellitus without complications: Secondary | ICD-10-CM | POA: Diagnosis not present

## 2017-02-19 DIAGNOSIS — C770 Secondary and unspecified malignant neoplasm of lymph nodes of head, face and neck: Secondary | ICD-10-CM | POA: Insufficient documentation

## 2017-02-19 DIAGNOSIS — I700 Atherosclerosis of aorta: Secondary | ICD-10-CM | POA: Insufficient documentation

## 2017-02-19 DIAGNOSIS — Z79899 Other long term (current) drug therapy: Secondary | ICD-10-CM | POA: Insufficient documentation

## 2017-02-19 DIAGNOSIS — Z87891 Personal history of nicotine dependence: Secondary | ICD-10-CM | POA: Diagnosis not present

## 2017-02-19 DIAGNOSIS — J320 Chronic maxillary sinusitis: Secondary | ICD-10-CM | POA: Diagnosis not present

## 2017-02-19 DIAGNOSIS — N200 Calculus of kidney: Secondary | ICD-10-CM | POA: Diagnosis not present

## 2017-02-19 DIAGNOSIS — E039 Hypothyroidism, unspecified: Secondary | ICD-10-CM | POA: Diagnosis not present

## 2017-02-19 DIAGNOSIS — Z808 Family history of malignant neoplasm of other organs or systems: Secondary | ICD-10-CM | POA: Insufficient documentation

## 2017-02-19 DIAGNOSIS — I10 Essential (primary) hypertension: Secondary | ICD-10-CM | POA: Insufficient documentation

## 2017-02-19 DIAGNOSIS — Z800 Family history of malignant neoplasm of digestive organs: Secondary | ICD-10-CM | POA: Diagnosis not present

## 2017-02-19 DIAGNOSIS — Z7984 Long term (current) use of oral hypoglycemic drugs: Secondary | ICD-10-CM | POA: Diagnosis not present

## 2017-02-19 DIAGNOSIS — F329 Major depressive disorder, single episode, unspecified: Secondary | ICD-10-CM | POA: Diagnosis not present

## 2017-02-19 DIAGNOSIS — M542 Cervicalgia: Secondary | ICD-10-CM | POA: Diagnosis not present

## 2017-02-19 NOTE — Progress Notes (Signed)
Patient here to establish care with Dr. Rogue Bussing. H/o tonsil/oropharyngeal cancer. Pt is on multiple supplements. He is unsure of the exact dosing and herbal supplements. Pt educated to bring list of herbal supplements and dosing at next visit.  He has no medical complaints. He had a recent pet scan- He is here to discuss these test results.

## 2017-02-19 NOTE — Progress Notes (Signed)
Radiation Oncology Follow up Note  Name: Dwayne Williams   Date:   02/19/2017 MRN:  [id number] DOB: February 05, 1956    This 61 y.o. male presents to the clinic today for follow-up for recurrent [place] cell carcinoma the right tonsil status post incomplete surgical resection and follow-up PET CT scan.  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 He had locally advanced disease with hypermetabolic activity in his right neck. He declined chemoradiation with [facility] had head and neck surgery for 1.5 cm tumor with negative margins although there was perineural invasion lymphovascular invasion and 525 nodes positive. Lymph nodes also showed X capsular spread. He turned down adjuvant therapy. He is now presenting with occasional neck pain CT scan showed a new mass in the right tonsillectomy bed encasing the right external carotid artery. Also enlargement of right lateral retropharyngeal nodes. We ordered repeat PET CT scan showing abnormal hypermetabolic activity in right station IIa lymph nodes measuring 1.8 cm. There was also noted an 8 x 5 mm nodule anterior medial right upper lobe not seemingly hypermetabolic suggestion for continued surveillance was made. He's been seen by medical oncology. Patient somewhat confused about proton therapy versus photon fair therapy they've also discussed possible Erbitux treatment with the patient. Patient continues to have some right neck pain. Seen today for discussion and follow-up opinion.  COMPLICATIONS OF TREATMENT: none  FOLLOW UP COMPLIANCE: keeps appointments   PHYSICAL EXAM:  There were no vitals taken for this visit. Well-developed well-nourished patient in NAD. HEENT reveals PERLA, EOMI, discs not visualized.  Oral cavity is clear. No oral mucosal lesions are identified. Neck is clear without evidence of cervical or supraclavicular  adenopathy. Lungs are clear to A&P. Cardiac examination is essentially unremarkable with regular rate and rhythm without murmur rub or thrill. Abdomen is benign with no organomegaly or masses noted. Motor sensory and DTR levels are equal and symmetric in the upper and lower extremities. Cranial nerves II through XII are grossly intact. Proprioception is intact. No peripheral adenopathy or edema is identified. No motor or sensory levels are noted. Crude visual fields are within normal range.  RADIOLOGY RESULTS: PET CT scan is reviewed  PLAN: At this time I will continue recommending salvage radiation therapy to his head and neck field. Would treat areas of hypermetabolic activity up to 4665 cGy dose painting remainder of his right neck to 5400 cGy using IM RT dose painting technique. I would try to spare contralateral parotid gland oral cavity spinal cord. Risks and benefits of treatment including loss of taste skin reaction fatigue oral mucositis radiation esophagitis all were discussed in detail with the patient and his wife. I have set him up for CT simulation next week. Patient would like to think about this. Also will discuss further at that time possible Erbitux although may just go with sole radiation therapy alone. According to patient's wishes.  I would like to take this opportunity to thank you for allowing me to participate in the care of your patient.Armstead Peaks., MD

## 2017-02-19 NOTE — Progress Notes (Signed)
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 July 2018- positive for recurrence AUG 31st 2018- PET-   # Bil hearing loss []      Tonsil cancer (HCC)     HISTORY OF PRESENTING ILLNESS:  Dwayne Williams 61 y.o.  male is here to discuss the treatment options of his recurrent squamous cell cancer of the tonsil. He has been evaluated by radiation oncology, referred to medical oncology for further systemic treatment options.  To summarize, patient was diagnosed with right tonsillar cancer with bilateral neck involvement-4 which was recommended chemoradiation. However patient chose to have Robotic surgery; which unfortunately on pathology noted to have positive margins; 5/25 lymph nodes positive. He was recommended adjuvant chemoradiation which he again declined.  Patient more recently in August 2018 noted to have worsening/ neck swelling- which led to a CT scan that was concerning for recurrence in the neck; and then followed by a PET scan which unfortunately confirms the CT scan findings.  Patient lives in [place]; however is extremely interested in the radiation option offered at [facility]. He is definitely not interested in chemotherapy/systemic therapy.  ROS: A complete 10 point review of system is done which is negative except mentioned above in history of present illness  MEDICAL HISTORY:  Past Medical History:  Diagnosis Date  . Cancer ([place])    Right Tonsil per PET scan  . Cervical lymphadenopathy   .  Depression   . Diabetes mellitus without complication ([place])   . H/O degenerative disc disease    chronic back pain  . Hypertension   . Hypothyroidism   . Migraines     SURGICAL HISTORY: Past Surgical History:  Procedure Laterality Date  . LITHOTRIPSY  X 2  . TONSILLECTOMY  age 24  . TOOTH EXTRACTION      SOCIAL HISTORY: Social History   Social History  . Marital status: Married    Spouse name: N/A  . Number of children: 0  . Years of education: N/A   Occupational History  . Not on file.   Social History Main Topics  . Smoking status: Never Smoker  . Smokeless tobacco: Never Used  . Alcohol use No  . Drug use: No  . 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 Not on file   Social History Narrative  . No narrative on file    FAMILY HISTORY: Family History  Problem Relation Age of Onset  . Cancer Father        kidney ca  . Cancer Brother        throat ca  . Cancer Maternal Uncle        melanoma  . Cancer Paternal Uncle        brain ca    ALLERGIES:  is allergic to codeine; etodolac; latex; penicillins; sertraline; topiramate; and zolpidem.  MEDICATIONS:  Current Outpatient Prescriptions  Medication Sig Dispense Refill  . AMLODIPINE BESYLATE PO Take 10 mg by mouth daily.    Marland Kitchen  Ascorbic Acid (VITA-C PO) Take 1 tablet by mouth daily.    . cloNIDine (CATAPRES) 0.1 MG tablet Take 0.1 mg by mouth daily.     . finasteride (PROPECIA) 1 MG tablet Take 1 mg by mouth daily.    Marland Kitchen GARLIC PO Take by mouth.    . GNP MAGNESIUM PO Take 1 tablet by mouth daily.    Marland Kitchen levothyroxine (SYNTHROID, LEVOTHROID) 25 MCG tablet Take 25 mcg by mouth daily before breakfast.    . metFORMIN (GLUCOPHAGE) 500 MG tablet Take 500 mg by mouth 2 (two) times daily with a meal.    . Multiple Vitamin (MULTIVITAMIN) tablet Take 1 tablet by mouth daily.    . NON FORMULARY Over the counter Cumin    . SELENIUM PO Take 1  capsule by mouth daily.    . traZODone (DESYREL) 50 MG tablet Take 50 mg by mouth at bedtime.     Marland Kitchen venlafaxine XR (EFFEXOR-XR) 150 MG 24 hr capsule Take 150 mg by mouth daily with breakfast.    . Vitamin Mixture (VITAMIN E COMPLETE) CAPS Take 1 capsule by mouth daily.     No current facility-administered medications for this visit.       Marland Kitchen  PHYSICAL EXAMINATION: ECOG PERFORMANCE STATUS: 0 - Asymptomatic  Vitals:   02/19/17 0841  BP: (!) 170/90  Pulse: 62  Resp: 20  Temp: 97.8 F (36.6 C)   Filed Weights   02/19/17 0841  Weight: 207 lb 12.8 oz (94.3 kg)    GENERAL: Well-nourished well-developed; Alert, no distress and comfortable.   Accompanied by his wife. EYES: no pallor or icterus OROPHARYNX: no thrush or ulceration; good dentition  NECK: supple, palpable lymphadenopathy noted in the right neck. LYMPH:  no palpable lymphadenopathy in the  axillary or inguinal regions LUNGS: clear to auscultation and  No wheeze or crackles HEART/CVS: regular rate & rhythm and no murmurs; No lower extremity edema ABDOMEN: abdomen soft, non-tender and normal bowel sounds Musculoskeletal:no cyanosis of digits and no clubbing  PSYCH: alert & oriented x 3 with fluent speech NEURO: no focal motor/sensory deficits SKIN:  no rashes or significant lesions  LABORATORY DATA:  I have reviewed the data as listed No results found for: WBC, HGB, HCT, MCV, PLT No results for input(s): NA, K, CL, CO2, GLUCOSE, BUN, CREATININE, CALCIUM, GFRNONAA, GFRAA, PROT, ALBUMIN, AST, ALT, ALKPHOS, BILITOT, BILIDIR, IBILI in the last 8760 hours.  RADIOGRAPHIC STUDIES: I have personally reviewed the radiological images as listed and agreed with the findings in the report. Nm Pet Image Restag (ps) Skull Base To Thigh  Result Date: 02/11/2017 CLINICAL DATA:  Subsequent treatment strategy for tonsillar cancer/oropharyngeal cancer. EXAM: NUCLEAR MEDICINE PET SKULL BASE TO THIGH TECHNIQUE: 12.7 mCi F-18 FDG was  injected intravenously. Full-ring PET imaging was performed from the skull base to thigh after the radiotracer. CT data was obtained and used for attenuation correction and anatomic localization. FASTING BLOOD GLUCOSE:  Value: 181 mg/dl COMPARISON:  Multiple exams, including 08/09/2015 FINDINGS: NECK Ill-defined calcifications at the site of the prior right station 2A lymph node which measures about 1.8 cm in short axis on image 38/3, previously 2.0 cm. Current maximum standard uptake value in this vicinity is 7.4, formerly 5.4. The prior asymmetric right tonsillar pillar activity which previously had a maximum SUV of 10.5 currently has a maximum SUV of only about 3.1. Dental activity noted in the right maxilla. No new adenopathy. Mild chronic right maxillary sinusitis. CHEST No hypermetabolic mediastinal or hilar nodes.  A 0.8 by 0.5 cm nodular density medially and anteriorly in the right upper lobe on image 92/3 was not present previously and is currently not perceptibly hypermetabolic. Atherosclerotic aortic arch. ABDOMEN/PELVIS No abnormal hypermetabolic activity within the liver, pancreas, adrenal glands, or spleen. No hypermetabolic lymph nodes in the abdomen or pelvis. Notable physiologic activity in the colon. Diffuse hepatic steatosis. 2 mm left kidney upper pole nonobstructive renal calculus. Aortoiliac atherosclerotic vascular disease. Sigmoid colon diverticulosis. SKELETON No focal hypermetabolic activity to suggest skeletal metastasis. IMPRESSION: 1. Although the original right tonsillar lesion is no longer well seen or hypermetabolic, there is continued accentuated abnormal activity in a right station IIa lymph node measuring 1.8 cm in short axis, current maximum SUV in this lymph node is 7.4, formerly 5.4. There are currently some calcifications associated with this lymph node, along with indistinct margins. 2. New 8 by 5 mm nodule in the anteromedial right upper lobe is not perceptibly hypermetabolic.  This may be postinflammatory but warrant surveillance. 3. Other imaging findings of potential clinical significance: Diffuse hepatic steatosis. Aortic Atherosclerosis (ICD10-I70.0). Nonobstructive left nephrolithiasis. Sigmoid colon diverticulosis. Chronic right maxillary sinusitis. New right maxillary dental activity the associated with a newly absent molar. Electronically Signed   By: Van Clines M.D.   On: 02/11/2017 13:19    ASSESSMENT & PLAN:   Tonsil cancer ([place]) # Recurrent stage IV tonsillar cancer- reviewed the pathology and staging the patient in detail. I reviewed the PET scan that shows unfortunately recurrence of his disease local /and also in the neck.   # I had a long discussion with patient regarding the conventional treatment options chemoradiation therapy. I discussed option of cisplatin [discussed multiple side effects including but not limited to nausea vomiting; renal failure neuropathy etc.] vs. cetuximab as alternative. Discussed treatment side effects of skin rash/infusion reaction etc. with cetuximab.   # Patient had multiple questions regarding radiation therapy- including proton beam versus photon therapy. Defer to radiation oncology.  # Patient lives in [place]; logistically he and his wife are interested in treatment closer to home. However they have not made any decisions yet.  # I reviewed the blood work- with the patient in detail; also reviewed the imaging independently [as summarized above]; and with the patient in detail.   # Patient states that he will talk to radiation oncology; but he still unsure regarding systemic therapy option at this time. He will let us know- if he is interested. No follow-up appointments made.  Thank you Dr. Donella Stade for allowing me to participate in the care of your pleasant patient. Please do not hesitate to contact me with questions or concerns in the interim. Above plan of care was discussed with Dr. Donella Stade.   Cc;  Dr.Harris   All questions were answered. The patient knows to call the clinic with any problems, questions or concerns.    Dwayne Sickle, MD 03/01/2017 8:02 PM

## 2017-02-19 NOTE — Assessment & Plan Note (Addendum)
#   Recurrent stage IV tonsillar cancer- reviewed the pathology and staging the patient in detail. I reviewed the PET scan that shows unfortunately recurrence of his disease local /and also in the neck.   # I had a long discussion with patient regarding the conventional treatment options chemoradiation therapy. I discussed option of cisplatin [discussed multiple side effects including but not limited to nausea vomiting; renal failure neuropathy etc.] vs. cetuximab as alternative. Discussed treatment side effects of skin rash/infusion reaction etc. with cetuximab.   # Patient had multiple questions regarding radiation therapy- including proton beam versus photon therapy. Defer to radiation oncology.  # Patient lives in [place]; logistically he and his wife are interested in treatment closer to home. However they have not made any decisions yet.  # I reviewed the blood work- with the patient in detail; also reviewed the imaging independently [as summarized above]; and with the patient in detail.   # Patient states that he will talk to radiation oncology; but he still unsure regarding systemic therapy option at this time. He will let us know- if he is interested. No follow-up appointments made.  Thank you Dr. Donella Stade for allowing me to participate in the care of your pleasant patient. Please do not hesitate to contact me with questions or concerns in the interim. Above plan of care was discussed with Dr. Donella Stade.   Cc; Dr.Harris

## 2017-02-25 ENCOUNTER — Ambulatory Visit: Payer: 59

## 2018-05-24 IMAGING — CT NM PET TUM IMG RESTAG (PS) SKULL BASE T - THIGH
1 of 10 series · 1 of 25 positions shown · non-contrast
Comparison: Multiple exams, including 08/09/2015

CLINICAL DATA: Subsequent treatment strategy for tonsillar
cancer/oropharyngeal cancer.

EXAM:
NUCLEAR MEDICINE PET SKULL BASE TO THIGH
TECHNIQUE: 12.7 mCi F-18 FDG was injected intravenously. Full-ring PET imaging
was performed from the skull base to thigh after the radiotracer. CT
data was obtained and used for attenuation correction and anatomic
localization.
FASTING BLOOD GLUCOSE:  Value: 181 mg/dl

[Series 3: ct wb 5.0 b30f · axial · 5.0mm · 0.98mm/px · 1 of 329 slices shown]
[im 329/329  brain]
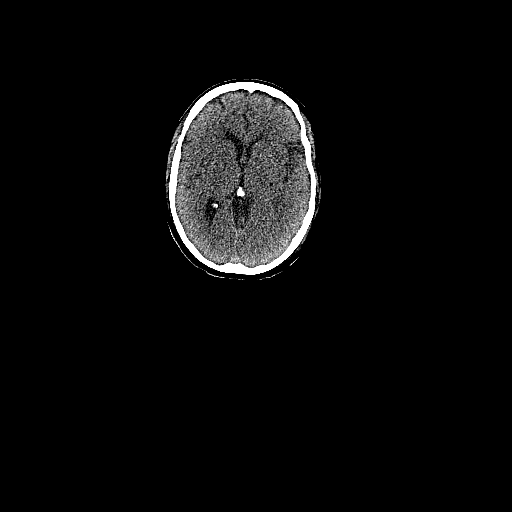

[1 of 25 positions shown; findings below may reference images not displayed]

FINDINGS: NECK

Ill-defined calcifications at the site of the prior right station 2A
lymph node which measures about 1.8 cm in short axis on image 38/3,
previously 2.0 cm. Current maximum standard uptake value in this
vicinity is 7.4, formerly 5.4.

The prior asymmetric right tonsillar pillar activity which
previously had a maximum SUV of 10.5 currently has a maximum SUV of
only about 3.1.

Dental activity noted in the right maxilla. No new adenopathy. Mild
chronic right maxillary sinusitis.

CHEST

No hypermetabolic mediastinal or hilar nodes. A 0.8 by 0.5 cm
nodular density medially and anteriorly in the right upper lobe on
image 92/3 was not present previously and is currently not
perceptibly hypermetabolic.

Atherosclerotic aortic arch.

ABDOMEN/PELVIS

No abnormal hypermetabolic activity within the liver, pancreas,
adrenal glands, or spleen. No hypermetabolic lymph nodes in the
abdomen or pelvis. Notable physiologic activity in the colon.

Diffuse hepatic steatosis. 2 mm left kidney upper pole
nonobstructive renal calculus.

Aortoiliac atherosclerotic vascular disease. Sigmoid colon
diverticulosis.

SKELETON

No focal hypermetabolic activity to suggest skeletal metastasis.
IMPRESSION: 1. Although the original right tonsillar lesion is no longer well
seen or hypermetabolic, there is continued accentuated abnormal
activity in a right station IIa lymph node measuring 1.8 cm in short
axis, current maximum SUV in this lymph node is 7.4, formerly 5.4.
There are currently some calcifications associated with this lymph
node, along with indistinct margins.
2. New 8 by 5 mm nodule in the anteromedial right upper lobe is not
perceptibly hypermetabolic. This may be postinflammatory but warrant
surveillance.
3. Other imaging findings of potential clinical significance:
Diffuse hepatic steatosis. Aortic Atherosclerosis ([id number]).
Nonobstructive left nephrolithiasis. Sigmoid colon diverticulosis.
Chronic right maxillary sinusitis. New right maxillary dental
activity the associated with a newly absent molar.

## 2018-06-25 ENCOUNTER — Other Ambulatory Visit: Payer: 59 | Admitting: Internal Medicine

## 2018-06-28 ENCOUNTER — Other Ambulatory Visit: Payer: 59 | Admitting: Internal Medicine

## 2018-06-28 DIAGNOSIS — Z515 Encounter for palliative care: Secondary | ICD-10-CM

## 2018-06-29 DIAGNOSIS — Z515 Encounter for palliative care: Secondary | ICD-10-CM | POA: Insufficient documentation

## 2018-06-29 NOTE — Progress Notes (Signed)
[facility] Telephone: [phone] Fax: [phone]  PATIENT NAME: Dwayne Williams DOB: 09-13-1955 MRN: [id number]  PRIMARY CARE PROVIDER:   Shirline Frees, MD  REFERRING PROVIDER:  Shirline Frees, MD [address], [place] [address]  RESPONSIBLE PARTY:   Self and [name] Rutigliano(wife) [phone]       RECOMMENDATIONS and PLAN:  1.  Palliative Care Encounter Z51.5:    -Advanced care planning: Joint meeting with patient and his wife.  Previous visit by Hospice nurse with plans for Palliative care while continuing treatments for cancer.  Discussion related to goals of care and future treatment plans, Code status and MOST form deliniations.  Pt plans on continuing to receive scans and study medications at [facility] in [place], [place] as long as there is progress noted per Oncologist.  Next scheduled trip is on 06/30/18.  He would like to improve strength, control pain and have the best quality of life possible.  Current code status is DNAR.  DNI and no tracheostomy.  MOST form selections are intermediate interventions, antibiotics and IV fluids if indicated.  He currently has a long term feeding tube and plans to continue to receive nutrition via same. Golden rod and MOST forms completed and left with wife. Plans are for transition to comfort care when treatments are no longer effective or recommended.  Wife will call this NP after returning from [facility] to set up next Palliative appt.   -Cancer related pain related to metastatic disease to the bone:  Controlled well with Xtampza(Oxycodone) ER 27mg  BID and Oxycodone 10mg  q 6h prn.  Advance doses and/or add Morphine concentrate in the future as needed with disease progression. Continue to monitor.  -Weight Loss related to advanced cancer: 22 lb. Weight loss in past 2.5 months.  Current weight 134 Ht 5'8"(BMI of 20.4) Wt. In 2017 was 208#.  Currently feeding with 1 Nutrin 1.5 and 1 Boost BID instead  of recommended 5 Nutrin 1.5 daily.  Encouraged gradual increase of Nutrin(begin with adding another container of Nutrin daily).  Spread out feedings throughout the day to prevent full stomach discomfort.  Goal would be to return to 5 Nutrin per day.  Consider adding Remeron 25 mg nightly.  -Chronic fatigue related to anemia, metastatic cancer, anorexia.  Frequent rest periods.  Adjust feedings as listed above  I spent 80 minutes providing this consultation at home,  from 1:00pm to 2:20pm. More than 50% of the time in this consultation was spent coordinating communication with pt and wife, reviewing medical records/medications and review of current treatment plans.   HISTORY OF PRESENT ILLNESS:  Dwayne Williams is a 63 y.o. 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  He also has chronic anemia, cancer related pain,  fatigue, dysphagia with receipt of nutrition via a feeding tube. He had surgical resection in  2017 at [facility] and was attempting alternative treatments. Wife reports that cancer has progressed metastasized to multiple locations. He receives lab studies and blood transfusions(06/24/18) at [facility] along with scans and trial medications at  [facility] in [place], [place].  He will travel to [facility] on 06/30/18.  Wife reports that pt occasionally skips tube feedings completely and has some confusion/forgetfulness.  He eats very little by mouth due to dysphagia.  She is his caregiver. He is continent and walks very short distances in his home.  Palliative Care was asked to help address goals of care.   CODE STATUS: DNAR/DNI  PPS: 40% HOSPICE ELIGIBILITY/DIAGNOSIS: YES after end of receipt of study drug for cancer treatment.  PAST MEDICAL HISTORY:  Past Medical History:  Diagnosis Date  . Cancer ([place])    Right Tonsil per PET scan  . Cervical lymphadenopathy   . Depression    . Diabetes mellitus without complication ([place])   . H/O degenerative disc disease    chronic back pain  . Hypertension   . Hypothyroidism   . Migraines    PHYSICAL EXAM:   General: Cachectic, fragile and fatigued appearing.  In NAD ENT:  Firm bilat submandibular lymphadenopathy Cardiovascular: Rapid rate and regular rhythm Pulmonary: clear all fields except LLL with noted fine wheezing Abdomen: soft, nontender, + bowel sounds.  Feeding tube present GU: no suprapubic tenderness Extremities: decreased muscle mass Skin: pale in color Neurological: Weakness, A&O x2. Psych: Slightly anxious. Cooperative  Gonzella Lex, NP-C

## 2018-06-30 ENCOUNTER — Encounter: Payer: Self-pay | Admitting: Internal Medicine

## 2018-07-06 ENCOUNTER — Telehealth: Payer: Self-pay | Admitting: Internal Medicine

## 2018-07-06 NOTE — Telephone Encounter (Signed)
Returned phone call to wife who reports that Mr Boulos is no longer eligible for study medication treatment of the metastatic tonsilar cancer per his oncologist in [place].  This conversation was had during his most recent trip to [facility].  He was offered no other treatments for cancer and recommended to proceed with Hospice care.  Pt and wife agree with this plan to transition to Hospice at home.  Support offered and I assured her that I would make the necessary calls to Dr. Kenton Kingfisher and the Hospice referral center on 07/06/18.  She thanked me for the call.

## 2018-08-15 DEATH — deceased
